# Patient Record
Sex: Female | Born: 1965
Health system: Southern US, Community
[De-identification: ages and names within clinical notes are randomized; demographics above are authoritative.]

## PROBLEM LIST (undated history)

## (undated) DIAGNOSIS — K219 Gastro-esophageal reflux disease without esophagitis: Secondary | ICD-10-CM

## (undated) DIAGNOSIS — J449 Chronic obstructive pulmonary disease, unspecified: Secondary | ICD-10-CM

## (undated) DIAGNOSIS — R112 Nausea with vomiting, unspecified: Secondary | ICD-10-CM

## (undated) DIAGNOSIS — J129 Viral pneumonia, unspecified: Secondary | ICD-10-CM

## (undated) DIAGNOSIS — G4730 Sleep apnea, unspecified: Secondary | ICD-10-CM

## (undated) DIAGNOSIS — Z9889 Other specified postprocedural states: Secondary | ICD-10-CM

## (undated) DIAGNOSIS — IMO0001 Reserved for inherently not codable concepts without codable children: Secondary | ICD-10-CM

## (undated) DIAGNOSIS — M797 Fibromyalgia: Secondary | ICD-10-CM

## (undated) DIAGNOSIS — H9190 Unspecified hearing loss, unspecified ear: Secondary | ICD-10-CM

## (undated) HISTORY — DX: Sleep apnea, unspecified: G47.30

## (undated) HISTORY — DX: Fibromyalgia: M79.7

## (undated) HISTORY — DX: Unspecified hearing loss, unspecified ear: H91.90

## (undated) HISTORY — PX: TOTAL ABDOMINAL HYSTERECTOMY: SHX209

## (undated) HISTORY — DX: Reserved for inherently not codable concepts without codable children: IMO0001

## (undated) HISTORY — DX: Viral pneumonia, unspecified: J12.9

## (undated) HISTORY — DX: Gastro-esophageal reflux disease without esophagitis: K21.9

---
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
## 2001-04-03 ENCOUNTER — Other Ambulatory Visit: Admission: RE | Admit: 2001-04-03 | Discharge: 2001-04-03 | Payer: Self-pay | Admitting: Obstetrics and Gynecology

## 2002-07-17 ENCOUNTER — Inpatient Hospital Stay (HOSPITAL_COMMUNITY): Admission: RE | Admit: 2002-07-17 | Discharge: 2002-07-20 | Payer: Self-pay | Admitting: Obstetrics & Gynecology

## 2002-12-18 ENCOUNTER — Other Ambulatory Visit: Admission: RE | Admit: 2002-12-18 | Discharge: 2002-12-18 | Payer: Self-pay | Admitting: Obstetrics & Gynecology

## 2008-07-09 ENCOUNTER — Encounter: Payer: Self-pay | Admitting: Internal Medicine

## 2008-07-11 ENCOUNTER — Ambulatory Visit: Payer: Self-pay | Admitting: Internal Medicine

## 2008-07-11 DIAGNOSIS — L509 Urticaria, unspecified: Secondary | ICD-10-CM | POA: Insufficient documentation

## 2008-07-11 DIAGNOSIS — J309 Allergic rhinitis, unspecified: Secondary | ICD-10-CM | POA: Insufficient documentation

## 2008-07-11 LAB — CONVERTED CEMR LAB
ALT: 16 units/L (ref 0–35)
AST: 18 units/L (ref 0–37)
Albumin: 4.6 g/dL (ref 3.5–5.2)
BUN: 15 mg/dL (ref 6–23)
Basophils Relative: 0.5 % (ref 0.0–3.0)
CO2: 29 meq/L (ref 19–32)
Chloride: 102 meq/L (ref 96–112)
Creatinine, Ser: 0.8 mg/dL (ref 0.4–1.2)
Eosinophils Absolute: 0 10*3/uL (ref 0.0–0.7)
Eosinophils Relative: 0.4 % (ref 0.0–5.0)
GFR calc non Af Amer: 84 mL/min
Glucose, Bld: 92 mg/dL (ref 70–99)
HCT: 41.1 % (ref 36.0–46.0)
MCV: 86.1 fL (ref 78.0–100.0)
Monocytes Relative: 5.4 % (ref 3.0–12.0)
Neutrophils Relative %: 70.8 % (ref 43.0–77.0)
RBC: 4.77 M/uL (ref 3.87–5.11)
Sed Rate: 10 mm/hr (ref 0–22)
Total Protein: 7.5 g/dL (ref 6.0–8.3)
WBC: 10.2 10*3/uL (ref 4.5–10.5)

## 2008-07-15 DIAGNOSIS — G4730 Sleep apnea, unspecified: Secondary | ICD-10-CM | POA: Insufficient documentation

## 2008-08-29 ENCOUNTER — Ambulatory Visit: Payer: Self-pay | Admitting: Internal Medicine

## 2008-08-29 DIAGNOSIS — G4700 Insomnia, unspecified: Secondary | ICD-10-CM

## 2009-05-06 ENCOUNTER — Ambulatory Visit: Payer: Self-pay | Admitting: Internal Medicine

## 2010-07-16 ENCOUNTER — Telehealth (INDEPENDENT_AMBULATORY_CARE_PROVIDER_SITE_OTHER): Payer: Self-pay

## 2010-07-22 ENCOUNTER — Ambulatory Visit: Payer: Self-pay | Admitting: Internal Medicine

## 2010-07-22 DIAGNOSIS — K219 Gastro-esophageal reflux disease without esophagitis: Secondary | ICD-10-CM

## 2010-07-22 DIAGNOSIS — R1310 Dysphagia, unspecified: Secondary | ICD-10-CM | POA: Insufficient documentation

## 2010-07-22 DIAGNOSIS — R1013 Epigastric pain: Secondary | ICD-10-CM | POA: Insufficient documentation

## 2010-07-23 ENCOUNTER — Encounter: Payer: Self-pay | Admitting: Internal Medicine

## 2010-08-17 ENCOUNTER — Ambulatory Visit (HOSPITAL_COMMUNITY): Admission: RE | Admit: 2010-08-17 | Discharge: 2010-08-17 | Payer: Self-pay | Admitting: Internal Medicine

## 2010-08-17 ENCOUNTER — Encounter (INDEPENDENT_AMBULATORY_CARE_PROVIDER_SITE_OTHER): Payer: Self-pay | Admitting: *Deleted

## 2010-08-17 ENCOUNTER — Ambulatory Visit: Payer: Self-pay | Admitting: Internal Medicine

## 2010-08-18 HISTORY — PX: ESOPHAGOGASTRODUODENOSCOPY: SHX1529

## 2010-08-19 ENCOUNTER — Encounter: Payer: Self-pay | Admitting: Gastroenterology

## 2010-08-24 ENCOUNTER — Encounter: Payer: Self-pay | Admitting: Internal Medicine

## 2010-11-17 NOTE — Medication Information (Signed)
Summary: ACIPHEX 20MG   ACIPHEX 20MG    Imported By: Rexene Alberts 08/17/2010 15:43:01  _____________________________________________________________________  External Attachment:    Type:   Image     Comment:   External Document  Appended Document: ACIPHEX 20MG  pt has tried prevacid, protonix, dexilant, prilosec two times a day. None of these stopped reflux. pt wants [place] to do a PA for Aciphex. She currently has some prilosec at home and is going to take it untill she hears back from [place] about PA.  Appended Document: ACIPHEX 20MG  Do we have samples Aciphex to give her for 2 week trial?  Proceed w/ PA.  Thanks  Appended Document: ACIPHEX 20MG  No samples of the Aciphex.  Appended Document: ACIPHEX 20MG  PA form on [facility] desk  Appended Document: ACIPHEX 20MG  PA has been approved and faxed to [facility]  Appended Document: ACIPHEX 20MG  pt aware, PA only good through 10/17/2010

## 2010-11-17 NOTE — Letter (Signed)
Summary: Patient Notice, Endo Biopsy Results  [facility]  [address]   [place], [place] [address]   Phone: [phone]  Fax: [phone]       August 24, 2010   Dawn Hammond [address] [place], [place]  [address] [birth year]    Dear Dawn Hammond,  I am pleased to inform you that the biopsies taken during your recent endoscopic examination did not show any evidence of cancer or other abnormality upon pathologic examination.  Additional information/recommendations:  No further action is needed at this time.  Please follow-up with your primary care physician for your other healthcare needs.  Continue with the treatment plan as outlined on the day of your exam.  Please call us if you are having persistent problems or have questions about your condition that have not been fully answered at this time.  Sincerely,    R. Roetta Sessions MD, FACP [facility] Ph: [phone]   Fax: [phone]   Appended Document: Patient Notice, Endo Biopsy Results letter mailed to pt

## 2010-11-17 NOTE — Assessment & Plan Note (Signed)
Summary: abd pain,consult for EGD/ss   Visit Type:  consult Referring Provider:  Dr. Sherryll Burger Primary Care Provider:  Dr. Sherryll Burger  Chief Complaint:  abd pain.  History of Present Illness: Ms. Dawn Hammond is a pleasant 45 y/o WF, patient of Dr. Sherryll Burger, who presents for further evaluation of abd pain and chronic GERD. She states she has had GERD since a teenager. Never had EGD. Has been on meds off/on for years. For couple of years, feels prickling feeling, short-lived, migrates around belly. Three weeks ago, prickling feeling and then epigastric burning. Gradually got worse. Burning then progressed throughout abd. Sometimes intermittent. Doesn't seemed to be related to meals. But notes OJ made worse and TUMS made worse. Pain localized in epigastrium. Burning into chest/throat. Little better on Thursday but after lifted heavy box yesterday the epigastric pain started again. No n/v.  Has been on meds chronically. Nexium, Prevacid doesn't control. Nocturnal regurgitation especially if overeat. Was taking Nexium, insurance made her take Prilosec 20mg  two times a day. Recently switched to Dexilant (given samples). Nothing really seems to help. Normally very constipated type person, but lately frequent soft stools. BM 2-3 per day. No nocturnal BMs. No melena, brbpr. Intermittent dysphagia to solid foods, happens several days in a row but may not happen but couple of times per month. Present for two to three years.   Current Medications (verified): 1)  Premarin 0.625 Mg Tabs (Estrogens Conjugated) .... Take 1 Tablet By Mouth Once A Day 2)  Ibuprofen 200 Mg Tabs (Ibuprofen) .... Take By Mouth As Needed 3)  Famotidine 20 Mg Tabs (Famotidine) .Marland Kitchen.. 1 Daily 4)  Loratadine 10 Mg Tabs (Loratadine) .... 2 Daily As Dierected 5)  Dexilant 60 Mg Cpdr (Dexlansoprazole) .... Once Daily  Allergies (verified): 1)  ! Oxycodone Hcl (Oxycodone Hcl) 2)  ! Amoxicillin (Amoxicillin) 3)  Hydrocodone  Past History:  Past Medical  History: Viral pneumonia 1984, hosp x 2, tracheostomy, "collapsed lung"  Impaired hearing due to antibiotics Chronic sinusitis- Dr. Lazarus Salines Sleep Apnea Urticaria- Skin test 05/06/09 Fibromyalgia GERD, chronic Allergies/hives, on famotidine/loratadine  Past Surgical History: Total Abdominal Hysterectomy  Family History: allergies cancers Maternal aunt, breast cancer. Maternal aunt, uterine cancer. Maternal GF, skin cancer. Paternal GM, esophageal problems. No FH of CRC.  Social History: Patient never smoked. No alcohol. Retired due to hearing impairment.  Married. [place].   Review of Systems General:  Denies fever, chills, sweats, anorexia, fatigue, malaise, and weight loss. Eyes:  Denies vision loss. ENT:  Complains of difficulty swallowing; denies nasal congestion, sore throat, and hoarseness. CV:  Complains of palpitations; denies chest pains, angina, dyspnea on exertion, and peripheral edema. Resp:  Denies dyspnea at rest, dyspnea with exercise, cough, sputum, and wheezing. GI:  See HPI. GU:  Denies urinary burning and blood in urine. MS:  Denies joint pain / LOM. Derm:  Denies rash and itching. Neuro:  Denies weakness, frequent headaches, memory loss, and confusion. Psych:  Denies depression and anxiety. Endo:  Denies unusual weight change. Heme:  Denies bruising and bleeding. Allergy:  Denies hives and rash.  Vital Signs:  Patient profile:   45 year old female Height:      64.5 inches Weight:      162 pounds BMI:     27.48 Temp:     98.2 degrees F oral Pulse rate:   76 / minute BP sitting:   122 / 84  (left arm) Cuff size:   regular  Vitals Entered By: Hendricks Limes LPN (July 22, 2010 11:18 AM)  Physical Exam  General:  Well developed, well nourished, no acute distress. Head:  Normocephalic and atraumatic. Eyes:  Conjunctivae pink, no scleral icterus.  Mouth:  Oropharyngeal mucosa moist, pink.  No lesions, erythema or exudate.    Neck:  Supple; no  masses or thyromegaly. Lungs:  Clear throughout to auscultation. Heart:  Regular rate and rhythm; no murmurs, rubs,  or bruits. Abdomen:  Bowel sounds normal.  Abdomen is soft, nondistended.  Moderate epigastric tenderness. No rebound or guarding.  No hepatosplenomegaly, masses or hernias.  No abdominal bruits.  Extremities:  No clubbing, cyanosis, edema or deformities noted. Neurologic:  Alert and  oriented x4;  grossly normal neurologically. Skin:  Intact without significant lesions or rashes. Cervical Nodes:  No significant cervical adenopathy. Psych:  Alert and cooperative. Normal mood and affect.  Impression & Recommendations:  Problem # 1:  EPIGASTRIC PAIN (ICD-789.06)  Progressive epigastric pain in setting of PPI and prn NSAID use. Also with refractory GERD/regurgitation, especially at night. She has h/o GERD for 30 years but no prior EGD. She c/o dysphagia to solid foods. Likely has esophageal ring or stricture but malignancy not excluded. EGD/ED to be performed in near future.  Risks, alternatives, benefits including but not limited to risk of reaction to medications, bleeding, infection, and perforation addressed.  Patient voiced understanding and verbal consent obtained.   Orders: Consultation Level III (10272) I would like to thank Dr. Sherryll Burger for allowing [place] to take part in the care of this nice patient.

## 2010-11-17 NOTE — Progress Notes (Signed)
Summary: pt ? about appt date  Phone Note Call from Patient Call back at Home Phone [phone]   Caller: Patient Summary of Call: pt called- has appt with LSL on 07/30/2010. Pt is requesting that I give LSL her symptoms to see if she needs sooner appt. pt stated her problems started with a "prickly feeling" in her stomach that turned into a burning sensation and now she feels burning and pressure in stomach and esophagus. She feels she is getting worse and if she has to wait untill the 13th she is not sure what kind of shape she will be in. She is currently taking dexilant qd from her pcp. please advise if you think pt needs sooner appt Initial call taken by: Hendricks Limes LPN,  July 16, 2010 11:09 AM     Appended Document: pt ? about appt date See if we can get her in next week.  Appended Document: pt ? about appt date pt is aware of her new appt day and time (07/22/10 @ 1130 w/LSL)

## 2010-11-17 NOTE — Letter (Signed)
Summary: EGD/ED ORDER  EGD/ED ORDER   Imported By: Ave Filter 07/22/2010 12:21:14  _____________________________________________________________________  External Attachment:    Type:   Image     Comment:   External Document

## 2010-11-17 NOTE — Letter (Signed)
Summary: REFERRAL FROM DR [facility]  REFERRAL FROM DR [facility]   Imported By: Rexene Alberts 07/23/2010 11:40:28  _____________________________________________________________________  External Attachment:    Type:   Image     Comment:   External Document

## 2010-12-21 ENCOUNTER — Encounter: Payer: Self-pay | Admitting: Gastroenterology

## 2010-12-29 NOTE — Medication Information (Signed)
Summary: PA for aciphex  PA for aciphex   Imported By: Hendricks Limes LPN 59/56/3875 64:33:29  _____________________________________________________________________  External Attachment:    Type:   Image     Comment:   External Document

## 2010-12-29 NOTE — Medication Information (Signed)
Summary: PA for aciphex  PA for aciphex   Imported By: Hendricks Limes LPN 04/54/0981 19:14:78  _____________________________________________________________________  External Attachment:    Type:   Image     Comment:   External Document

## 2011-01-29 ENCOUNTER — Other Ambulatory Visit: Payer: Self-pay | Admitting: Internal Medicine

## 2011-03-05 NOTE — Group Therapy Note (Signed)
   NAMEVANCE, BELCOURT                      ACCOUNT NO.:  [id number]   MEDICAL RECORD NO.:  [id number]                   PATIENT TYPE:  INP   LOCATION:  A327                                 FACILITY:  [facility]   PHYSICIAN:  Lazaro Arms, M.D.                DATE OF BIRTH:  Mar 31, 1966   DATE OF PROCEDURE:  07/18/2002  DATE OF DISCHARGE:                                   PROGRESS NOTE   TIME:  0840.   SUBJECTIVE:  The patient is postop day #1, a TAH-BSO.   The patient is awake, alert, without complaints.  Her pain is well-  controlled on the PCA pump.  She has no nausea and wants to eat regular  food.  She has voided.  She has been out of bed and ambulating without  difficulty.   OBJECTIVE:  Temperature maximum is 99.6, pulse is 90, respirations 20, blood  pressure is 99/56.  Urine output is excellent, and her urine is clear.   Postop day #1, hemoglobin is 10.6, hematocrit was 30.5, white count is 9100,  platelets 292,000, and the neutrophils are 68%.  Abdomen is soft,  appropriately tender, nondistended.  The incision is clean, dry, and intact.  Extremities warm with no edema.   ASSESSMENT:  Postoperative day #1, total abdominal hysterectomy-bilateral  salpingo-oophorectomy.   PLAN:  We will do routine postop care, discontinue her IV and PCA pump and  begin oral pain medicine.  Discontinue IV Toradol and give oral Toradol.  Liberalize her diet to regular and have her ambulate, and otherwise routine  postop care.                                                 Lazaro Arms, M.D.    Loraine Maple  D:  07/18/2002  T:  07/18/2002  Job:  [id number]

## 2011-03-05 NOTE — Discharge Summary (Signed)
   NAMEHIILEI, GERST                      ACCOUNT NO.:  [id number]   MEDICAL RECORD NO.:  [id number]                   PATIENT TYPE:  INP   LOCATION:  A327                                 FACILITY:  [facility]   PHYSICIAN:  Lazaro Arms, M.D.                DATE OF BIRTH:  [birth year]   DATE OF ADMISSION:  07/17/2002  DATE OF DISCHARGE:  07/20/2002                                 DISCHARGE SUMMARY   HISTORY OF PRESENT ILLNESS:  The patient is postoperative day #3 from a  TAH/BSO.  She has a T-max in the past 24 hours of 100.4.  Her current  temperature is 97.5.  Her pulse is 85, respirations are 18, and blood  pressure is 98/63.  Urine output is excellent and clear.  She is ambulatory,  tolerating a regular diet.  She is voiding without symptoms.  She has had  multiple bowel movements and passing gas.   PHYSICAL EXAMINATION:  ABDOMEN:  Her abdomen is less distended than  yesterday.  It is soft and evidence that her ileus is resolving.  Her  incision is clean, dry, and intact.  EXTREMITIES:  Warm with no edema.   LABORATORY DATA:  Hemoglobin and hematocrit this morning were 9.4 and 27.5  and a white count of 7900.  Platelet count is 235,000.   ASSESSMENT:  The patient is postoperative day #3 from a total abdominal  hysterectomy/bilateral salpingo-oophorectomy.   PLAN:  The patient is ready for discharge.   DISCHARGE MEDICATIONS:  1. Percocet 10/650 mg #50 one to two p.o. q.4h. p.r.n. pain.  2. Toradol 10 mg #15 one p.o. q.8h. x5 days.  3. Ambien 10 mg 1 p.o. q.h.s. p.r.n. sleep.   FOLLOW UP:  We will see her back in the office on October 7 in the  afternoon.  She is given instructions and precautions for return prior to  that time.  If she has any other problems that require evaluation, she is  instructed to contact our office.                                               Lazaro Arms, M.D.    Loraine Maple  D:  07/20/2002  T:  07/23/2002  Job:  [id number]

## 2011-03-05 NOTE — Op Note (Signed)
Dawn Hammond, Dawn Hammond                      ACCOUNT NO.:  [id number]   MEDICAL RECORD NO.:  [id number]                   PATIENT TYPE:  AMB   LOCATION:  DAY                                  FACILITY:  [facility]   PHYSICIAN:  Lazaro Arms, M.D.                DATE OF BIRTH:  Nov 05, 1965   DATE OF PROCEDURE:  07/17/2002  DATE OF DISCHARGE:                                 OPERATIVE REPORT   PREOPERATIVE DIAGNOSES:  1. Dysfunctional bleeding.  2. Dysmenorrhea.  3. Severe PMDD.   POSTOPERATIVE DIAGNOSES:  1. Dysfunctional bleeding.  2. Dysmenorrhea.  3. Severe PMDD.   PROCEDURE:  1. Total abdominal hysterectomy.  2. Bilateral salpingo-oophorectomy   SURGEON:  Lazaro Arms, M.D.   ANESTHESIA:  General endotracheal   FINDINGS:  The patient had both ovaries stuck to the posterior of the  uterus.  There may have been some endometriosis present, not definitively,  but that was a possibility.  There were really no other abnormalities in the  pelvis.   DESCRIPTION OF PROCEDURE:  The patient was taken to the operating room and  placed in the supine position and underwent general endotracheal anesthesia.  She was prepped and draped in the usual sterile fashion.  A Pfannenstiel  skin incision made and carried down sharply through the rectus fascia which  was scored in the midline and extended laterally.  The fascia was taken off  the muscle superiorly and inferiorly without difficult.  The muscles were  divided.  The peritoneal cavity was entered.  A Balfour self-retaining  retractor was placed.  The upper abdomen was packed away.   The uterus was grasped and the cornu. The left round ligament was suture  ligated and cut.  The vesicouterine serosal flap on the left was created.  The infundibulopelvic ligament, on the left, was clamped, cut, and double  suture ligated.  The right round ligament was then suture ligated and cut.  The vesicouterine serosal flap on the right was created  and the bladder was  pushed off the lower uterine segment.  The infundibulopelvic ligament on the  right was clamped, cut and double suture ligated.  The uterine vessels were  skeletonized and clamped, cut, and suture ligated.   Serial pedicles were taken down the cervix through the Cardinal ligament  with each pedicle being clamped, cut, transfixed, and suture ligated.   The vagina was cross clamped, specimen was removed, and vaginal angle  sutures were placed and the vagina was closed with interrupted figure-of-  eight sutures.  The uterosacral ligaments were then plicated in the midline  and sutured to the vagina as well for additional vaginal support.  The  pelvis was irrigated vigorously.  All pedicles were found to be hemostatic.  The patient tolerated the procedure well.  She experienced 350 cc of blood  loss.  Balfour self-retaining retractor was removed.  The muscles were  reapproximated loosely.  The fascia was closed using #0 Vicryl running.  Subcutaneous tissue was made hemostatic and irrigated.  Subcutaneous sutures  were placed, interrupted, and then subcuticular and Dermabond was used for  skin adhesive and for dressing.  The patient tolerated the procedure well;  taken to the recovery room in good stable condition.  All counts were  correct x3.                                               Lazaro Arms, M.D.    Loraine Maple  D:  07/17/2002  T:  07/17/2002  Job:  [id number]

## 2011-03-05 NOTE — H&P (Signed)
Dawn Hammond, Dawn Hammond                      ACCOUNT NO.:  [id number]   MEDICAL RECORD NO.:  [id number]                   PATIENT TYPE:  AMB   LOCATION:  DAY                                  FACILITY:  [facility]   PHYSICIAN:  Lazaro Arms, M.D.                DATE OF BIRTH:  25-Jul-1966   DATE OF ADMISSION:  DATE OF DISCHARGE:                                HISTORY & PHYSICAL   HISTORY OF PRESENT ILLNESS:  The patient is a 45 year old  white female  gravida 4, para 1 abortus 3 who is admitted for a TH/BSO.  She has been  having really almost constant spotting, fatigue, leg cramps, heavy bleeding  with her periods and lots of pain with her periods.  We put on a milligram  of Estrace to try to prevent intermenstrual spotting and her menstrual  migraines, but was that was to no avail.  Thyroid function tests, FSH and LH  were all drawn, and were normal.  She also has a lot of problems with severe  premenstrual dysphoric disorder.  She has body aches right at the time of  her menses, leg cramping and, of course, intermittent spotting, which have  not responded.  We have talked about doing a hysteroscopy, D&C, endometrial  ablation, and hormonal manipulation; however, after talking to her for  awhile and the significant nature of her symptoms I really felt like a  TH/BSO was more indicated.  As a result we will proceed with that.  She  understands she will require hormone replacement therapy.   PAST MEDICAL HISTORY:  Significant for depression and severe premenstrual  dysphoric disorder.   PAST SURGICAL HISTORY:  Negative.   PAST OBSTETRICAL HISTORY:  She has an elective termination and miscarriage  with a D&C for a 14-week pregnancy loss.  She also had a pregnancy that was  ended at seven months because the patient was in a coma and thought she was  going to do poorly.   CURRENT MEDICATIONS:  Ambien, Vioxx and Aleve, and ibuprofen as well.  She  takes Celexa for premenstrual  dysphoric disorder.   REVIEW OF SYSTEMS:  Her review of systems is otherwise negative.   PHYSICAL EXAMINATION:  VITAL SIGNS:  Blood pressure 120/80, weight 156  pounds.  HEENT:  Unremarkable.  NECK:  Thyroid is normal.  LUNGS:  Clear.  HEART:  Regular rate and rhythm without murmur, rub or gallop.  BREASTS:  Without mass, discharge or skin changes.  ABDOMEN:  Benign.  No hepatosplenomegaly or masses.  GENITALIA:  She has normal external genitalia.  Vagina is without discharge.  Cervix parous without lesions.  Uterus; normal size, shape and contour.  Ovaries are normal and nontender.  EXTREMITIES:  Warm without edema.  NEUROLOGIC EXAMINATION:  Grossly intact.   IMPRESSION:  1. Dysfunction uterine bleeding not responsive to conservative measures.  2. Dysmenorrhea.  3. Severe premenstrual dysphoric disorder.  4. Other  cyclical symptoms consistent with negative fibromyalgia picture.   PLAN:  The patient is admitted for TH/BSO.   She understands the risks, benefits, indications, and alternatives, and will  proceed.                                                   Lazaro Arms, M.D.    Dawn Hammond  D:  07/16/2002  T:  07/17/2002  Job:  [id number]

## 2011-03-05 NOTE — Group Therapy Note (Signed)
   Dawn Hammond, AXELSON                      ACCOUNT NO.:  [id number]   MEDICAL RECORD NO.:  [id number]                   PATIENT TYPE:  INP   LOCATION:  A327                                 FACILITY:  [facility]   PHYSICIAN:  Lazaro Arms, M.D.                DATE OF BIRTH:  01/04/1966   DATE OF PROCEDURE:  07/19/2002  DATE OF DISCHARGE:                                   PROGRESS NOTE   SUBJECTIVE:  The patient feels pretty good.  Her pain is in better control  today than yesterday.  She has been ambulatory, passing gas but still feels  distended, no bowel movement.  She is voiding without symptoms.   OBJECTIVE:  Her T-max is 100.3, her pulse is 105, respirations are 20, blood  pressure is 102/62.  Abdomen I will say is mild to moderately distended.  She has positive bowel sounds but it is soft.  Her incision is clean, dry  and intact.  She has stable bruising in the superior left aspect of the  incision.  There is no evidence of a hematoma.  Extremities are warm without  edema.   ASSESSMENT:  1. Postoperative day #2, total abdominal hysterectomy/bilateral salpingo-     oophorectomy.  2. Mild ileus postoperatively.   PLAN:  In view of the patient's suppositories, I will have her ambulate  today and keep her today and probably evaluate for discharge tomorrow.  She  will have a CBC ordered for in the morning.                                                Lazaro Arms, M.D.    Loraine Maple  D:  07/19/2002  T:  07/19/2002  Job:  [id number]

## 2011-04-03 LAB — CBC
HCT: 39 %
MCV: 84.7 fL
platelet count: 310

## 2011-04-03 LAB — COMPREHENSIVE METABOLIC PANEL
ALT: 15 U/L (ref 7–35)
AST: 17 U/L
Albumin: 4.1
Alkaline Phosphatase: 56 U/L
Calcium: 8.8 mg/dL
Total Bilirubin: 0.5 mg/dL

## 2011-06-23 ENCOUNTER — Ambulatory Visit (INDEPENDENT_AMBULATORY_CARE_PROVIDER_SITE_OTHER): Payer: Medicare HMO | Admitting: Gastroenterology

## 2011-06-23 ENCOUNTER — Encounter: Payer: Self-pay | Admitting: Gastroenterology

## 2011-06-23 DIAGNOSIS — R1013 Epigastric pain: Secondary | ICD-10-CM

## 2011-06-23 DIAGNOSIS — R1032 Left lower quadrant pain: Secondary | ICD-10-CM | POA: Insufficient documentation

## 2011-06-23 DIAGNOSIS — K449 Diaphragmatic hernia without obstruction or gangrene: Secondary | ICD-10-CM

## 2011-06-23 DIAGNOSIS — R1310 Dysphagia, unspecified: Secondary | ICD-10-CM

## 2011-06-23 DIAGNOSIS — K5900 Constipation, unspecified: Secondary | ICD-10-CM | POA: Insufficient documentation

## 2011-06-23 MED ORDER — POLYETHYLENE GLYCOL 3350 17 GM/SCOOP PO POWD
17.0000 g | Freq: Every day | ORAL | Status: DC
Start: 2011-06-23 — End: 2011-07-14

## 2011-06-23 NOTE — Progress Notes (Signed)
Primary Care Physician:  [name], MD  Primary Gastroenterologist:  Roetta Sessions, MD   Chief Complaint  Patient presents with  . Abdominal Pain    stomach burning  . Hiatal Hernia    pain on the left side    HPI:  Dawn Hammond is a 45 y.o. female here for further evaluation of new LLQ pain which radiates into left abd and into epigastrium. At times burning quality.  May be there for several days or weeks and then none for several weeks. No fever. Pain not worse with meals. Not necessarily worse with movement. Nothing makes it better. Always constipation. Takes Calcium for osteopenia. Given Bentyl for constipation but has not started yet. Equate stools softners with laxative. Straining to have BM makes abd pain worse. No melena, bpbpr. May take two days after taking laxative to have BM.   No heartburn. Follows antireflux measures. No dysphagia.    Current Outpatient Prescriptions  Medication Sig Dispense Refill  . dicyclomine (BENTYL) 10 MG capsule Take 10 mg by mouth 4 (four) times daily -  before meals and at bedtime.        Marland Kitchen estradiol (ESTRACE) 1 MG tablet Take 1 mg by mouth daily.       . famotidine (PEPCID) 20 MG tablet Take 20 mg by mouth 2 (two) times daily.        Marland Kitchen ibuprofen (ADVIL,MOTRIN) 200 MG tablet Take 200 mg by mouth every 6 (six) hours as needed.        . loratadine (CLARITIN) 10 MG tablet Take 10 mg by mouth daily.        Marland Kitchen omeprazole (PRILOSEC) 20 MG capsule Take 20 mg by mouth daily.       . polyethylene glycol powder (GLYCOLAX/MIRALAX) powder Take 17 g by mouth daily.  255 g  0    Allergies as of 06/23/2011 - Review Complete 06/23/2011  Allergen Reaction Noted  . Amoxicillin  07/22/2010  . Hydrocodone    . Oxycodone hcl  07/22/2010    Past Medical History  Diagnosis Date  . Pneumonia, viral 1984    x2 tracheostomy/ Collapsed lung  . Impaired hearing   . Chronic sinusitis   . Sleep apnea   . Fibromyalgia   . GERD (gastroesophageal reflux  disease)   . Urticaria     Past Surgical History  Procedure Date  . Total abdominal hysterectomy   . Esophagogastroduodenoscopy 08/2010    noncritical schatzki ring s/p dilation, small hh, biopsy negative for EE    Family History  Problem Relation Age of Onset  . Breast cancer Maternal Aunt   . Uterine cancer Maternal Aunt   . Colon cancer Neg Hx     History   Social History  . Marital Status: Married    Spouse Name: N/A    Number of Children: N/A  . Years of Education: N/A   Occupational History  . retired due to hearing impairment    Social History Main Topics  . Smoking status: Never Smoker   . Smokeless tobacco: Not on file  . Alcohol Use: Yes     a glass of wine here and there  . Drug Use: No  . Sexually Active: Not on file   Other Topics Concern  . Not on file   Social History Narrative   stepson      ROS:  General: Negative for anorexia, weight loss, fever, chills, fatigue, weakness. Eyes: Negative for vision changes.  ENT: Negative for hoarseness,  difficulty swallowing , nasal congestion. CV: Negative for chest pain, angina, palpitations, dyspnea on exertion, peripheral edema.  Respiratory: Negative for dyspnea at rest, dyspnea on exertion, cough, sputum, wheezing.  GI: See history of present illness. GU:  Negative for dysuria, hematuria, urinary incontinence, urinary frequency, nocturnal urination.  MS: Negative for joint pain, low back pain.  Derm: Negative for rash or itching.  Neuro: Negative for weakness, abnormal sensation, seizure, frequent headaches, memory loss, confusion.  Psych: Negative for anxiety, depression, suicidal ideation, hallucinations.  Endo: Negative for unusual weight change.  Heme: Negative for bruising or bleeding. Allergy: Negative for rash or hives.    Physical Examination:  BP 113/78  Pulse 67  Temp(Src) 97.8 F (36.6 C) (Temporal)  Ht 5\' 4"  (1.626 m)  Wt 155 lb 6.4 oz (70.489 kg)  BMI 26.67 kg/m2   General:  Well-nourished, well-developed in no acute distress.  Head: Normocephalic, atraumatic.   Eyes: Conjunctiva pink, no icterus. Mouth: Oropharyngeal mucosa moist and pink , no lesions erythema or exudate. Neck: Supple without thyromegaly, masses, or lymphadenopathy.  Lungs: Clear to auscultation bilaterally.  Heart: Regular rate and rhythm, no murmurs rubs or gallops.  Abdomen: Bowel sounds are normal, LLQ tenderness without rebound, nondistended, no hepatosplenomegaly or masses, no abdominal bruits or hernia.   Rectal: Not performed. Extremities: No lower extremity edema. No clubbing or deformities.  Neuro: Alert and oriented x 4 , grossly normal neurologically.  Skin: Warm and dry, no rash or jaundice.   Psych: Alert and cooperative, normal mood and affect.

## 2011-06-23 NOTE — Assessment & Plan Note (Addendum)
Miralax 17g daily. Check ifobt. Retrieve labs from PCP to see if thyroid and calcium levels done.

## 2011-06-23 NOTE — Assessment & Plan Note (Signed)
LLQ pain into upper abdomen seems to be unrelated or unaffected by bowel movements. She has chronic constipation but manages it with stool softner with laxative 2 times a week. CT A/P for further evaluation. May ultimately need colonoscopy.

## 2011-06-24 NOTE — Progress Notes (Signed)
Cc to PCP 

## 2011-06-29 ENCOUNTER — Telehealth: Payer: Self-pay | Admitting: Gastroenterology

## 2011-06-29 NOTE — Telephone Encounter (Signed)
Pt called to let us know she is know having R sided pelvic pain, this started on Saturday.  She is scheduled for her CT scan on 07/06/11.

## 2011-06-30 NOTE — Telephone Encounter (Signed)
Please get more info on how patient is doing. Bowels moving? Fever? Location of abd pain, was having epig and llq. Can we do CT sooner.?

## 2011-07-02 ENCOUNTER — Ambulatory Visit (INDEPENDENT_AMBULATORY_CARE_PROVIDER_SITE_OTHER): Payer: Medicare HMO | Admitting: Gastroenterology

## 2011-07-02 DIAGNOSIS — R109 Unspecified abdominal pain: Secondary | ICD-10-CM

## 2011-07-05 NOTE — Telephone Encounter (Signed)
Spoke with pt- she is feeling better, only has pain at times, usually when she lays down at night to go to bed. Having ct tomorrow.

## 2011-07-05 NOTE — Progress Notes (Signed)
Quick Note:  IFOBT negative. CT tomorrow. Still need labs from PCP, please request again. How is patient doing?? ______

## 2011-07-06 ENCOUNTER — Ambulatory Visit (HOSPITAL_COMMUNITY)
Admission: RE | Admit: 2011-07-06 | Discharge: 2011-07-06 | Disposition: A | Discharge status: Home / Self Care | Payer: Medicare HMO | Source: Ambulatory Visit | Attending: Gastroenterology | Admitting: Gastroenterology

## 2011-07-06 DIAGNOSIS — R1013 Epigastric pain: Secondary | ICD-10-CM | POA: Insufficient documentation

## 2011-07-06 DIAGNOSIS — R1310 Dysphagia, unspecified: Secondary | ICD-10-CM

## 2011-07-06 DIAGNOSIS — R1032 Left lower quadrant pain: Secondary | ICD-10-CM | POA: Insufficient documentation

## 2011-07-06 MED ORDER — IOHEXOL 300 MG/ML  SOLN
100.0000 mL | Freq: Once | INTRAMUSCULAR | Status: AC | PRN
  Administered 2011-07-06: 100 mL via INTRAVENOUS

## 2011-07-07 NOTE — Progress Notes (Signed)
Requested for the second time

## 2011-07-11 NOTE — Progress Notes (Signed)
Quick Note:  Let's bring patient back in for f/u OV to discuss next step. May need TCS. Would like to discuss CT findings with her at OV. ?early cirrhosis? ______

## 2011-07-12 NOTE — Progress Notes (Signed)
Quick Note:  Please see result note attached to CT. ______

## 2011-07-13 NOTE — Progress Notes (Signed)
Pt is scheduled to see LL on 07/14/11

## 2011-07-14 ENCOUNTER — Ambulatory Visit (INDEPENDENT_AMBULATORY_CARE_PROVIDER_SITE_OTHER): Payer: Medicare HMO | Admitting: Gastroenterology

## 2011-07-14 ENCOUNTER — Encounter: Payer: Self-pay | Admitting: Gastroenterology

## 2011-07-14 VITALS — BP 119/76 | HR 77 | Temp 98.1°F | Ht 64.0 in | Wt 155.0 lb

## 2011-07-14 DIAGNOSIS — K5900 Constipation, unspecified: Secondary | ICD-10-CM

## 2011-07-14 DIAGNOSIS — R1013 Epigastric pain: Secondary | ICD-10-CM

## 2011-07-14 DIAGNOSIS — R1032 Left lower quadrant pain: Secondary | ICD-10-CM

## 2011-07-14 DIAGNOSIS — R945 Abnormal results of liver function studies: Secondary | ICD-10-CM

## 2011-07-14 DIAGNOSIS — N9489 Other specified conditions associated with female genital organs and menstrual cycle: Secondary | ICD-10-CM | POA: Insufficient documentation

## 2011-07-14 DIAGNOSIS — R932 Abnormal findings on diagnostic imaging of liver and biliary tract: Secondary | ICD-10-CM | POA: Insufficient documentation

## 2011-07-14 MED ORDER — POLYETHYLENE GLYCOL 3350 17 GM/SCOOP PO POWD: 17.0000 g | Freq: Every day | ORAL | Status: AC

## 2011-07-14 NOTE — Assessment & Plan Note (Signed)
Chronic constipation, left-sided abdominal pain. Unclear if abdominal pain is secondary to pelvic congestion syndrome, constipation, multifactorial. We have discussed possibility of colonoscopy for worsening constipation/bowel change. Patient will like to know what her financial responsibilities will be prior to scheduling.

## 2011-07-14 NOTE — Assessment & Plan Note (Signed)
Mostly pelvic pain and left-sided abdominal pain at this point. Continue PPI. Continue to monitor.

## 2011-07-14 NOTE — Patient Instructions (Addendum)
#  1 Your recent CT scan showed pelvic congestion syndrome, nodular liver, scarring and emphysema of the lung bases. #2 Recommend you followup with your gynecologist regarding pelvic congestion syndrome. This may be contributing to your pelvic pain. #3 We will do some blood work to further evaluate for any liver abnormality. We will call you with results. #4 I will review your CT scan with the radiologist and it back in touch with you. #5 We will look into your financial responsibilities for a colonoscopy. If you have not heard from our office within the next one week please call us and check on this. #6 I have sent a prescription for MiraLax to [facility] in [place]. Please start taking daily, hold for diarrhea. You may continue equally stool softener with laxative on an as-needed basis. Hopefully once you have been on MiraLax for a few days you will no longer need stool softener with laxative.

## 2011-07-14 NOTE — Assessment & Plan Note (Signed)
Recommend that she followup with her GYN.

## 2011-07-14 NOTE — Progress Notes (Signed)
Primary Care Physician:  [name], MD  Primary Gastroenterologist:  Roetta Sessions, MD   Chief Complaint  Patient presents with  . Colonoscopy    HPI:  Dawn Hammond is a 45 y.o. female here to reevaluate abdominal pain, discussed CT findings. She was seen on 06/23/2011. At that time she was complaining of new left lower quadrant pain which radiated up into the left abdomen and into the epigastrium. Pain persisted for days or weeks but they go several weeks without any. No correlation with bowel movements or meals. Complains of chronic constipation. Takes stool softener with laxative daily. BM better on equate stool with laxative. May take most days. No brbpr, melena. No heartburn. No dysphagia. No colonoscopy.    Since her last office visit we obtained a copy of lab results from June 2012, ordered by her PCP. Her CBC, LFTs, thyroid all normal. Normal calcium level. I FOBT was negative. CT scan of the abdomen and pelvis show pulmonary scarring and emphysema in the bases. Some lobular area of margins of the liver raising the possibility of developing cirrhosis. Few slightly prominent veins which drain into the gonadal vein on the left, raising the possibility of pelvic congestion syndrome.  Patient notes pelvic pain is more predominant when she lifts or strains. States she has chronic dyspnea on exertion since she had viral pneumonia years ago.   Current Outpatient Prescriptions  Medication Sig Dispense Refill  . estradiol (ESTRACE) 1 MG tablet Take 1 mg by mouth daily.       . famotidine (PEPCID) 20 MG tablet Take 20 mg by mouth 2 (two) times daily.        Marland Kitchen ibuprofen (ADVIL,MOTRIN) 200 MG tablet Take 200 mg by mouth every 6 (six) hours as needed.        . loratadine (CLARITIN) 10 MG tablet Take 10 mg by mouth daily.        Marland Kitchen omeprazole (PRILOSEC) 20 MG capsule Take 20 mg by mouth daily.         Allergies as of 07/14/2011 - Review Complete 07/14/2011  Allergen Reaction Noted  .  Amoxicillin  07/22/2010  . Hydrocodone    . Oxycodone hcl  07/22/2010    Past Medical History  Diagnosis Date  . Pneumonia, viral 1984    x2 tracheostomy/ Collapsed lung  . Impaired hearing   . Chronic sinusitis   . Sleep apnea   . Fibromyalgia   . GERD (gastroesophageal reflux disease)   . Urticaria     Past Surgical History  Procedure Date  . Total abdominal hysterectomy   . Esophagogastroduodenoscopy 08/2010    noncritical schatzki ring s/p dilation, small hh, biopsy negative for EE    Family History  Problem Relation Age of Onset  . Breast cancer Maternal Aunt   . Uterine cancer Maternal Aunt   . Colon cancer Neg Hx     History   Social History  . Marital Status: Married    Spouse Name: N/A    Number of Children: N/A  . Years of Education: N/A   Occupational History  . retired due to hearing impairment    Social History Main Topics  . Smoking status: Never Smoker   . Smokeless tobacco: Not on file  . Alcohol Use: No  . Drug Use: No  . Sexually Active: Not on file   Other Topics Concern  . Not on file   Social History Narrative   stepson      ROS:  General:  Negative for anorexia, weight loss, fever, chills, fatigue, weakness. Eyes: Negative for vision changes.  ENT: Negative for hoarseness, difficulty swallowing , nasal congestion. Chronic hearing loss. CV: Negative for chest pain, angina, palpitations, dyspnea on exertion, peripheral edema.  Respiratory: Negative for dyspnea at rest, dyspnea on exertion, cough, sputum, wheezing.  GI: See history of present illness. GU:  Negative for dysuria, hematuria, urinary incontinence, urinary frequency, nocturnal urination.  MS: Negative for joint pain, low back pain.  Derm: Negative for rash or itching.  Neuro: Negative for weakness, abnormal sensation, seizure, frequent headaches, memory loss, confusion.  Psych: Negative for anxiety, depression, suicidal ideation, hallucinations.  Endo: Negative for  unusual weight change.  Heme: Negative for bruising or bleeding. Allergy: Negative for rash or hives.    Physical Examination:  BP 119/76  Pulse 77  Temp(Src) 98.1 F (36.7 C) (Temporal)  Ht 5\' 4"  (1.626 m)  Wt 155 lb (70.308 kg)  BMI 26.61 kg/m2   General: Well-nourished, well-developed in no acute distress.  Head: Normocephalic, atraumatic.   Eyes: Conjunctiva pink, no icterus. Mouth: Oropharyngeal mucosa moist and pink , no lesions erythema or exudate. Neck: Supple without thyromegaly, masses, or lymphadenopathy.  Lungs: Clear to auscultation bilaterally.  Heart: Regular rate and rhythm, no murmurs rubs or gallops.  Abdomen: Bowel sounds are normal, mild left-sided abdominal tenderness, nondistended, no hepatosplenomegaly or masses, no abdominal bruits or    hernia , no rebound or guarding.   Rectal: Not performed Extremities: No lower extremity edema. No clubbing or deformities.  Neuro: Alert and oriented x 4 , grossly normal neurologically.  Skin: Warm and dry, no rash or jaundice.   Psych: Alert and cooperative, normal mood and affect.  Labs: Lab Results  Component Value Date   WBC 7.0 04/03/2011   HGB 13.5 04/03/2011   HCT 39 04/03/2011   MCV 84.7 04/03/2011      07/11/2008                                         Lab Results  Component Value Date   ALT 15 04/03/2011   AST 17 04/03/2011   ALKPHOS 56 04/03/2011   BILITOT 0.5 04/03/2011     Imaging Studies: Ct Abdomen Pelvis W Contrast  07/06/2011  *RADIOLOGY REPORT*  Clinical Data: Left lower quadrant and epigastric pain.  Previous hysterectomy.  CT ABDOMEN AND PELVIS WITH CONTRAST  Technique:  Multidetector CT imaging of the abdomen and pelvis was performed following the standard protocol during bolus administration of intravenous contrast.  Contrast: OMNIPAQUE IOHEXOL 300 MG/ML IV SOLN  Comparison: None.  Findings: Lung bases show pulmonary scarring and emphysema.  No pleural or pericardial fluid.  There  is some lobular area of the margins of the liver raising the possibility of developing cirrhosis.  No focal lesion or ductal dilatation.  No calcified gallstones.  The spleen is normal.  The pancreas is normal.  The adrenal glands are normal.  The kidneys are normal.  No cyst, mass, stone or hydronephrosis.  The aorta and IVC are normal.  No retroperitoneal mass or adenopathy.  No free intraperitoneal fluid or air.  No bowel pathology noted.  The appendix is normal. Bladder appears normal.  No osseous abnormality.  No visible hernia.  Along the left pelvic sidewall, there are a few slightly prominent veins which drain into the gonadal vein on the left.  It  is conceivable this could relate to pelvic congestion syndrome, but the findings are not pronounced.  IMPRESSION: Pulmonary scarring and emphysema.  Some lobulated the of the margins of the liver raising the possibility of early cirrhosis.  No focal lesion or ductal dilatation.  No acute abdominal or pelvic finding  to explain pain.  A few prominent veins along the left pelvic sidewall draining into the left gonadal vein.  Findings could conceivably relate to pelvic congestion syndrome, but this is not pronounced.  Original Report Authenticated By: Thomasenia Sales, M.D.

## 2011-07-14 NOTE — Assessment & Plan Note (Signed)
Questionable early developing cirrhosis on CT. No clear risk factors for patient. She has some lung disease which she feels is related to her significant viral pneumonia years ago but at this point would consider ruling out alpha 1 antitrypsin deficiency. Also check other labs looking for chronic liver disease.

## 2011-07-15 LAB — COMPREHENSIVE METABOLIC PANEL
AST: 17 U/L (ref 0–37)
Albumin: 4.9 g/dL (ref 3.5–5.2)
Alkaline Phosphatase: 59 U/L (ref 39–117)
BUN: 11 mg/dL (ref 6–23)
Calcium: 9.6 mg/dL (ref 8.4–10.5)
Chloride: 102 mEq/L (ref 96–112)
Glucose, Bld: 71 mg/dL (ref 70–99)
Potassium: 4.3 mEq/L (ref 3.5–5.3)
Sodium: 142 mEq/L (ref 135–145)
Total Protein: 7 g/dL (ref 6.0–8.3)

## 2011-07-15 LAB — PROTIME-INR: INR: 0.9 (ref <1.50)

## 2011-07-15 LAB — ALPHA-1-ANTITRYPSIN: A-1 Antitrypsin, Ser: 147 mg/dL (ref 90–200)

## 2011-07-15 NOTE — Progress Notes (Signed)
Cc to PCP 

## 2011-07-20 ENCOUNTER — Telehealth: Payer: Self-pay | Admitting: Gastroenterology

## 2011-07-20 NOTE — Telephone Encounter (Signed)
Pt called wanting her lab results. She can be reached at [phone]

## 2011-07-20 NOTE — Telephone Encounter (Signed)
Routed to LSL 

## 2011-07-20 NOTE — Progress Notes (Signed)
Quick Note:  Labs ok. No evidence of hemochromatosis, Wilson's disease, or alpha 1 antitrypsin deficiency.  What is status of scheduling TCS. Pt wanted to know what her financial responsibilities are before scheduling and I don't see any documentation about this.  Please let the pt know, no definitive answer regarding possible liver disease yet. I tried to review CT with radiologist last week, but Dr. Tyron Russell was out. Hopefully can review it with him on Thursday. Also will discuss with RMR upon his return next week and then I will call the patient regarding new information. ______

## 2011-07-20 NOTE — Telephone Encounter (Signed)
Please see result note 

## 2011-07-21 ENCOUNTER — Telehealth: Payer: Self-pay | Admitting: Internal Medicine

## 2011-07-21 NOTE — Telephone Encounter (Signed)
Patient called and stated she wanted lab results. She said that she called yesterday an noone returned her call.

## 2011-07-21 NOTE — Telephone Encounter (Signed)
Pt stated she would contact her insurance company to see if she had met her deductible

## 2011-07-21 NOTE — Telephone Encounter (Signed)
Pt aware of results, she said it was ok to schedule tcs.

## 2011-07-22 ENCOUNTER — Other Ambulatory Visit: Payer: Self-pay | Admitting: Gastroenterology

## 2011-07-22 NOTE — Progress Notes (Signed)
Quick Note:  Reviewed films with Dr. Tyron Russell. He did not agree with call for ?early cirrhosis. Only area of ?lobulation seen was on right hepatic margin which appeared to be secondary to ribs/musculature not liver edge.   I will discuss with Dr. Jena Gauss next week. ______

## 2011-07-22 NOTE — Progress Notes (Signed)
PT IS SCHEDULED FOR TCS ON 10/24- INSTRUCTIONS MAILED

## 2011-07-22 NOTE — Progress Notes (Signed)
Quick Note:  Please let pt know. I reviewed her films with radiologist and discussed with Dr. Jena Gauss. Her labs were good. Radiologist does not think there is any issue with her liver.   Consider abd u/s in two years to follow-up. Please NIC.  She does have scaring and emphysema in bases, ?related to previous viral pneumonia but she may consider following up with PCP or pulmonologist given her age.   Colonoscopy as planned. ______

## 2011-07-28 ENCOUNTER — Other Ambulatory Visit: Payer: Self-pay | Admitting: Gastroenterology

## 2011-07-28 DIAGNOSIS — R109 Unspecified abdominal pain: Secondary | ICD-10-CM

## 2011-07-28 MED ORDER — PEG-KCL-NACL-NASULF-NA ASC-C 100 G PO SOLR: 1.0000 | Freq: Once | ORAL | Status: DC

## 2011-08-10 MED ORDER — SODIUM CHLORIDE 0.45 % IV SOLN
Freq: Once | INTRAVENOUS | Status: AC
  Administered 2011-08-11: 09:00:00 via INTRAVENOUS

## 2011-08-11 ENCOUNTER — Ambulatory Visit (HOSPITAL_COMMUNITY)
Admission: RE | Admit: 2011-08-11 | Discharge: 2011-08-11 | Disposition: A | Discharge status: Home / Self Care | Payer: Medicare HMO | Source: Ambulatory Visit | Attending: Internal Medicine | Admitting: Internal Medicine

## 2011-08-11 ENCOUNTER — Encounter (HOSPITAL_COMMUNITY): Payer: Self-pay | Admitting: *Deleted

## 2011-08-11 ENCOUNTER — Encounter (HOSPITAL_COMMUNITY)
Admission: RE | Disposition: A | Discharge status: Home / Self Care | Payer: Self-pay | Source: Ambulatory Visit | Attending: Internal Medicine

## 2011-08-11 DIAGNOSIS — R198 Other specified symptoms and signs involving the digestive system and abdomen: Secondary | ICD-10-CM

## 2011-08-11 DIAGNOSIS — R1032 Left lower quadrant pain: Secondary | ICD-10-CM | POA: Insufficient documentation

## 2011-08-11 HISTORY — DX: Chronic obstructive pulmonary disease, unspecified: J44.9

## 2011-08-11 HISTORY — PX: COLONOSCOPY: SHX5424

## 2011-08-11 SURGERY — COLONOSCOPY
Anesthesia: Moderate Sedation

## 2011-08-11 MED ORDER — MIDAZOLAM HCL 5 MG/5ML IJ SOLN
INTRAMUSCULAR | Status: AC
  Filled 2011-08-11: qty 10

## 2011-08-11 MED ORDER — MIDAZOLAM HCL 5 MG/5ML IJ SOLN
INTRAMUSCULAR | Status: DC | PRN
  Administered 2011-08-11: 2 mg via INTRAVENOUS
  Administered 2011-08-11: 1 mg via INTRAVENOUS

## 2011-08-11 MED ORDER — MEPERIDINE HCL 100 MG/ML IJ SOLN
INTRAMUSCULAR | Status: AC
  Filled 2011-08-11: qty 1

## 2011-08-11 MED ORDER — STERILE WATER FOR IRRIGATION IR SOLN
Status: DC | PRN
  Administered 2011-08-11: 10:00:00

## 2011-08-11 MED ORDER — MEPERIDINE HCL 100 MG/ML IJ SOLN
INTRAMUSCULAR | Status: DC | PRN
  Administered 2011-08-11: 50 mg via INTRAVENOUS
  Administered 2011-08-11: 25 mg via INTRAVENOUS

## 2011-08-11 NOTE — H&P (Signed)
Tana Coast, PA  07/14/2011  4:47 PM  Signed Primary Care Physician:  [name], MD   Primary Gastroenterologist:  Roetta Sessions, MD      Chief Complaint   Patient presents with   .  Colonoscopy      HPI:  Dawn Hammond is a 45 y.o. female here to reevaluate abdominal pain, discussed CT findings. She was seen on 06/23/2011. At that time she was complaining of new left lower quadrant pain which radiated up into the left abdomen and into the epigastrium. Pain persisted for days or weeks but they go several weeks without any. No correlation with bowel movements or meals. Complains of chronic constipation. Takes stool softener with laxative daily. BM better on equate stool with laxative. May take most days. No brbpr, melena. No heartburn. No dysphagia. No colonoscopy.     Since her last office visit we obtained a copy of lab results from June 2012, ordered by her PCP. Her CBC, LFTs, thyroid all normal. Normal calcium level. I FOBT was negative. CT scan of the abdomen and pelvis show pulmonary scarring and emphysema in the bases. Some lobular area of margins of the liver raising the possibility of developing cirrhosis. Few slightly prominent veins which drain into the gonadal vein on the left, raising the possibility of pelvic congestion syndrome.   Patient notes pelvic pain is more predominant when she lifts or strains. States she has chronic dyspnea on exertion since she had viral pneumonia years ago.     Current Outpatient Prescriptions   Medication  Sig  Dispense  Refill   .  estradiol (ESTRACE) 1 MG tablet  Take 1 mg by mouth daily.          .  famotidine (PEPCID) 20 MG tablet  Take 20 mg by mouth 2 (two) times daily.           Marland Kitchen  ibuprofen (ADVIL,MOTRIN) 200 MG tablet  Take 200 mg by mouth every 6 (six) hours as needed.           .  loratadine (CLARITIN) 10 MG tablet  Take 10 mg by mouth daily.           Marland Kitchen  omeprazole (PRILOSEC) 20 MG capsule  Take 20 mg by mouth daily.               Allergies as of 07/14/2011 - Review Complete 07/14/2011   Allergen  Reaction  Noted   .  Amoxicillin    07/22/2010   .  Hydrocodone       .  Oxycodone hcl    07/22/2010       Past Medical History   Diagnosis  Date   .  Pneumonia, viral  1984       x2 tracheostomy/ Collapsed lung   .  Impaired hearing     .  Chronic sinusitis     .  Sleep apnea     .  Fibromyalgia     .  GERD (gastroesophageal reflux disease)     .  Urticaria         Past Surgical History   Procedure  Date   .  Total abdominal hysterectomy     .  Esophagogastroduodenoscopy  08/2010       noncritical schatzki ring s/p dilation, small hh, biopsy negative for EE       Family History   Problem  Relation  Age of Onset   .  Breast cancer  Maternal Aunt     .  Uterine cancer  Maternal Aunt     .  Colon cancer  Neg Hx         History       Social History   .  Marital Status:  Married       Spouse Name:  N/A       Number of Children:  N/A   .  Years of Education:  N/A       Occupational History   .  retired due to hearing impairment         Social History Main Topics   .  Smoking status:  Never Smoker    .  Smokeless tobacco:  Not on file   .  Alcohol Use:  No   .  Drug Use:  No   .  Sexually Active:  Not on file       Other Topics  Concern   .  Not on file       Social History Narrative     stepson        ROS:   General: Negative for anorexia, weight loss, fever, chills, fatigue, weakness. Eyes: Negative for vision changes.   ENT: Negative for hoarseness, difficulty swallowing , nasal congestion. Chronic hearing loss. CV: Negative for chest pain, angina, palpitations, dyspnea on exertion, peripheral edema.   Respiratory: Negative for dyspnea at rest, dyspnea on exertion, cough, sputum, wheezing.   GI: See history of present illness. GU:  Negative for dysuria, hematuria, urinary incontinence, urinary frequency, nocturnal urination.   MS: Negative for joint pain, low back  pain.   Derm: Negative for rash or itching.   Neuro: Negative for weakness, abnormal sensation, seizure, frequent headaches, memory loss, confusion.   Psych: Negative for anxiety, depression, suicidal ideation, hallucinations.   Endo: Negative for unusual weight change.   Heme: Negative for bruising or bleeding. Allergy: Negative for rash or hives.     Physical Examination:   BP 119/76  Pulse 77  Temp(Src) 98.1 F (36.7 C) (Temporal)  Ht 5\' 4"  (1.626 m)  Wt 155 lb (70.308 kg)  BMI 26.61 kg/m2    General: Well-nourished, well-developed in no acute distress.   Head: Normocephalic, atraumatic.    Eyes: Conjunctiva pink, no icterus. Mouth: Oropharyngeal mucosa moist and pink , no lesions erythema or exudate. Neck: Supple without thyromegaly, masses, or lymphadenopathy.   Lungs: Clear to auscultation bilaterally.   Heart: Regular rate and rhythm, no murmurs rubs or gallops.   Abdomen: Bowel sounds are normal, mild left-sided abdominal tenderness, nondistended, no hepatosplenomegaly or masses, no abdominal bruits or    hernia , no rebound or guarding.    Rectal: Not performed Extremities: No lower extremity edema. No clubbing or deformities.   Neuro: Alert and oriented x 4 , grossly normal neurologically.   Skin: Warm and dry, no rash or jaundice.    Psych: Alert and cooperative, normal mood and affect.   Labs: Lab Results   Component  Value  Date     WBC  7.0  04/03/2011     HGB  13.5  04/03/2011     HCT  39  04/03/2011     MCV  84.7  04/03/2011          07/11/2008  Lab Results   Component  Value  Date     ALT  15  04/03/2011     AST  17  04/03/2011     ALKPHOS  56  04/03/2011     BILITOT  0.5  04/03/2011        Imaging Studies: Ct Abdomen Pelvis W Contrast   07/06/2011  *RADIOLOGY REPORT*  Clinical Data: Left lower quadrant and epigastric pain.  Previous hysterectomy.  CT ABDOMEN AND  PELVIS WITH CONTRAST  Technique:  Multidetector CT imaging of the abdomen and pelvis was performed following the standard protocol during bolus administration of intravenous contrast.  Contrast: OMNIPAQUE IOHEXOL 300 MG/ML IV SOLN  Comparison: None.  Findings: Lung bases show pulmonary scarring and emphysema.  No pleural or pericardial fluid.  There is some lobular area of the margins of the liver raising the possibility of developing cirrhosis.  No focal lesion or ductal dilatation.  No calcified gallstones.  The spleen is normal.  The pancreas is normal.  The adrenal glands are normal.  The kidneys are normal.  No cyst, mass, stone or hydronephrosis.  The aorta and IVC are normal.  No retroperitoneal mass or adenopathy.  No free intraperitoneal fluid or air.  No bowel pathology noted.  The appendix is normal. Bladder appears normal. No osseous abnormality.  No visible hernia.  Along the left pelvic sidewall, there are a few slightly prominent veins which drain into the gonadal vein on the left.  It is conceivable this could relate to pelvic congestion syndrome, but the findings are not pronounced.  IMPRESSION: Pulmonary scarring and emphysema.  Some lobulated the of the margins of the liver raising the possibility of early cirrhosis.  No focal lesion or ductal dilatation.  No acute abdominal or pelvic finding  to explain pain.  A few prominent veins along the left pelvic sidewall draining into the left gonadal vein.  Findings could conceivably relate to pelvic congestion syndrome, but this is not pronounced.  Original Report Authenticated By: Thomasenia Sales, M.D.            Glendora Score  07/15/2011  9:27 AM  Signed Cc to PCP  Tana Coast, PA  07/20/2011  1:23 PM  Signed Quick Note:   Labs ok. No evidence of hemochromatosis, Wilson's disease, or alpha 1 antitrypsin deficiency.   What is status of scheduling TCS. Pt wanted to know what her financial responsibilities are before scheduling and  I don't see any documentation about this.   Please let the pt know, no definitive answer regarding possible liver disease yet. I tried to review CT with radiologist last week, but Dr. Tyron Russell was out. Hopefully can review it with him on Thursday. Also will discuss with RMR upon his return next week and then I will call the patient regarding new information. ______  Cherene Julian Dawn  07/22/2011  8:40 AM  Signed PT IS SCHEDULED FOR TCS ON 10/24- INSTRUCTIONS MAILED       Constipation - Tana Coast, PA  07/14/2011  4:43 PM  Signed Chronic constipation, left-sided abdominal pain. Unclear if abdominal pain is secondary to pelvic congestion syndrome, constipation, multifactorial. We have discussed possibility of colonoscopy for worsening constipation/bowel change. Patient will like to know what her financial responsibilities will be prior to scheduling.  Pelvic congestion syndrome - Tana Coast, PA  07/14/2011  4:44 PM  Signed Recommend that she followup with her GYN.  Abnormal CT of liver - Tana Coast, PA  07/14/2011  4:45 PM  Signed Questionable early developing cirrhosis on CT. No clear risk factors for patient. She has some lung disease which she feels is related to her significant viral pneumonia years ago but at this point would consider ruling out alpha 1 antitrypsin deficiency. Also check other labs looking for chronic liver disease.  EPIGASTRIC PAIN - Tana Coast, PA  07/14/2011  4:46 PM  Signed Mostly pelvic pain and left-sided abdominal pain at this point. Continue PPI. Continue to monitor.      I have seen & examined the patient prior to the procedure(s) today and reviewed the history and physical/consultation.  There have been no changes.  After consideration of the risks, benefits, alternatives and imponderables, the patient has consented to the procedure(s).

## 2011-08-18 ENCOUNTER — Encounter (HOSPITAL_COMMUNITY): Payer: Self-pay | Admitting: Internal Medicine

## 2011-08-31 ENCOUNTER — Ambulatory Visit (INDEPENDENT_AMBULATORY_CARE_PROVIDER_SITE_OTHER): Payer: Medicare HMO | Admitting: Internal Medicine

## 2011-08-31 ENCOUNTER — Other Ambulatory Visit (INDEPENDENT_AMBULATORY_CARE_PROVIDER_SITE_OTHER): Payer: Medicare HMO

## 2011-08-31 ENCOUNTER — Encounter: Payer: Self-pay | Admitting: Internal Medicine

## 2011-08-31 ENCOUNTER — Telehealth: Payer: Self-pay | Admitting: Internal Medicine

## 2011-08-31 VITALS — BP 98/54 | HR 113 | Temp 97.5°F | Ht 64.0 in | Wt 152.8 lb

## 2011-08-31 DIAGNOSIS — L509 Urticaria, unspecified: Secondary | ICD-10-CM

## 2011-08-31 DIAGNOSIS — R945 Abnormal results of liver function studies: Secondary | ICD-10-CM

## 2011-08-31 DIAGNOSIS — R932 Abnormal findings on diagnostic imaging of liver and biliary tract: Secondary | ICD-10-CM

## 2011-08-31 LAB — CBC WITH DIFFERENTIAL/PLATELET
Basophils Relative: 0 % (ref 0.0–3.0)
Eosinophils Absolute: 0.1 10*3/uL (ref 0.0–0.7)
Hemoglobin: 14.4 g/dL (ref 12.0–15.0)
Lymphs Abs: 1.3 10*3/uL (ref 0.7–4.0)
MCHC: 34.5 g/dL (ref 30.0–36.0)
MCV: 85.7 fl (ref 78.0–100.0)
Monocytes Absolute: 0.7 10*3/uL (ref 0.1–1.0)
Neutro Abs: 18.2 10*3/uL — ABNORMAL HIGH (ref 1.4–7.7)
RBC: 4.89 Mil/uL (ref 3.87–5.11)

## 2011-08-31 NOTE — Patient Instructions (Signed)
Go ahead with the prednisone as planned  Order- lab- CBC w/ diff, BMET  We will research availability of sensitivity testing for the narcotics, cleocin and titanium as requested, but may not be able to offer those tests.  Test for a1ATdeficiency

## 2011-08-31 NOTE — Telephone Encounter (Signed)
Per CDY, okay to add pt to his scheduled to day at 3:45pm so he can assess rash. Katie aware.

## 2011-08-31 NOTE — Progress Notes (Signed)
08/31/11-45 year old woman walked into the office this afternoon with husband asking allergy evaluation because of rash. She had been seen in 2009 for new onset urticaria starting around 2008. Workup at that time demonstrated no pattern related to food or hormones. Allergy skin testing had shown mild to moderate responses to a number of grass weed and tree pollens, animal danders and dust mite, indicating atopy.  One week ago she had oral surgery for broken tooth. The tooth was extracted with plan for implant. Extraction was done with office anesthesia. Subsequently she began to dilaudid and Cleocin, with Peridex mouth rinse. 2 days ago she developed a rash starting on her back and now confluent over limbs and trunk. She has not had fever, jaw pain, angioedema, joint pain, abdominal cramps or wheezing. She has felt "exhausted". She blames Dilaudid for of nausea with vomiting after a total of 6 tablets, discontinued. She finished the Cleocin 2 days ago. She thinks the Peridex mouthwash made her tongue numb, but without other oral irritation.   Last night she passed out and collapsed on her bedroom floor as she was leaving the bathroom. She revived quickly with husband present. She started Benadryl one week ago. She went to her primary physician, Dr Eduard Clos in [place], who gave her cortisone injection and started prednisone taper. She comes now because of persistent rash. She still feels weak. Itching is controlled.  Recent colonoscopy by Dr. Benard Rink was well-tolerated. She never smoked cigarettes significantly and does not use alcohol. Allergic to penicillin with rash but no prior Cleocin exposure that she is aware of.  ROS-see HPI Constitutional:   No-   weight loss, night sweats, fevers, chills, + fatigue, lassitude. HEENT:   No-  headaches, difficulty swallowing, tooth/dental problems, sore throat,       No-  sneezing, itching, ear ache, nasal congestion, post nasal drip,  CV:  No-   chest pain,  orthopnea, PND, swelling in lower extremities, anasarca,                                  dizziness, palpitations Resp: No-   shortness of breath with exertion or at rest.              No-   productive cough,  No non-productive cough,  No- coughing up of blood.              No-   change in color of mucus.  No- wheezing.   Skin:per HPI GI:  No-   heartburn, indigestion, abdominal pain, nausea is resolved, vomiting, diarrhea,                 change in bowel habits, loss of appetite GU: No-   dysuria, change in color of urine, no urgency or frequency.  No- flank pain. MS:  No-   joint pain or swelling.  No- decreased range of motion.  No- back pain. Neuro-     nothing unusual Psych:  No- change in mood or affect. No depression or anxiety.  No memory loss.  OBJ General- Alert, Oriented, Affect-appropriate, Distress- none acute Skin-  Diffuse macular erythematous rash on trunk and limbs up to neck Lymphadenopathy- none Head- atraumatic            Eyes- Gross vision intact, PERRLA, conjunctivae clear secretions            Ears- Hearing, canals-normal  Nose- Clear, no-Septal dev, mucus, polyps, erosion, perforation             Throat- Mallampati II , mucosa clear , drainage- none, tonsils- atrophic Neck- flexible , trachea midline, no stridor , thyroid nl, carotid no bruit Chest - symmetrical excursion , unlabored           Heart/CV- RRR , no murmur , no gallop  , no rub, nl s1 s2                           - JVD- none , edema- none, stasis changes- none, varices- none           Lung- clear to P&A, wheeze- none, cough- none , dullness-none, rub- none           Chest wall-  Abd- tender-no, distended-no, bowel sounds-present, HSM- no Br/ Gen/ Rectal- Not done, not indicated Extrem- cyanosis- none, clubbing, none, atrophy- none, strength- nl Neuro- grossly intact to observation

## 2011-09-01 ENCOUNTER — Ambulatory Visit: Payer: Medicare HMO | Admitting: Gastroenterology

## 2011-09-01 LAB — BASIC METABOLIC PANEL
BUN: 11 mg/dL (ref 6–23)
Calcium: 9 mg/dL (ref 8.4–10.5)
Creatinine, Ser: 0.9 mg/dL (ref 0.4–1.2)
GFR: 68.34 mL/min (ref >60.00)

## 2011-09-02 ENCOUNTER — Other Ambulatory Visit: Payer: Self-pay | Admitting: Pulmonary Disease

## 2011-09-02 DIAGNOSIS — L509 Urticaria, unspecified: Secondary | ICD-10-CM

## 2011-09-03 NOTE — Assessment & Plan Note (Signed)
Drug rash most likely related to Cleocin. She only stopped the medication 2 days ago so clearing may take a few days. At this time I don't see evidence of anaphylaxis or Stevens-Johnson syndrome. She blames the Dilaudid for nausea which would be consistent with common narcotic side effect. I can't exclude an amnestic response to second exposure of a sedating agent after the colonoscopy and then the dental procedure I think that is less likely.  We have checked with the laboratory. There are no specific sensitivity assays available for Cleocin, Dilaudid or titanium. Any further treatment should avoid use of Cleocin and should use narcotics cautiously. It would be safest to perform procedures in a setting where anesthesia support is available against the possibility of hypotension or respiratory distress.

## 2011-09-03 NOTE — Assessment & Plan Note (Signed)
There was some question of liver abnormality on recent CT scan. She has not used alcohol. Note remote history of pneumothorax with chest tube complicating pneumonia. In this nonsmoker, there is a small possibility that she has combined lung and liver disease associated with Alpha I antitrypsin deficiency. This will be tested but is unlikely.

## 2011-09-07 ENCOUNTER — Telehealth: Payer: Self-pay | Admitting: Internal Medicine

## 2011-09-07 NOTE — Telephone Encounter (Signed)
I spoke with Dawn Hammond and she wanted to know if she could go to the grocery store. She stated Dr. Maple Hudson advised not to go out and to take care of herself. Dawn Hammond then stated she will just send her husband to the grocery store for her. Dawn Hammond needed nothing further

## 2011-09-10 ENCOUNTER — Ambulatory Visit: Payer: Medicare HMO

## 2011-09-10 DIAGNOSIS — L509 Urticaria, unspecified: Secondary | ICD-10-CM

## 2011-09-10 LAB — CBC WITH DIFFERENTIAL/PLATELET
Eosinophils Relative: 1.7 % (ref 0.0–5.0)
Lymphocytes Relative: 26.3 % (ref 12.0–46.0)
MCV: 87.2 fl (ref 78.0–100.0)
Monocytes Absolute: 0.7 10*3/uL (ref 0.1–1.0)
Monocytes Relative: 7.4 % (ref 3.0–12.0)
Neutrophils Relative %: 64.3 % (ref 43.0–77.0)
Platelets: 409 10*3/uL — ABNORMAL HIGH (ref 150.0–400.0)
WBC: 9.6 10*3/uL (ref 4.5–10.5)

## 2011-09-13 NOTE — Progress Notes (Signed)
REVIEWED.  

## 2011-09-16 ENCOUNTER — Telehealth: Payer: Self-pay | Admitting: Internal Medicine

## 2011-09-16 NOTE — Telephone Encounter (Signed)
Called and spoke with pt.  Pt aware of cbc results from 09/10/11.  Informed pt once we get the results of her A1AT we will call her.  Pt verbalized understanding and denied any questions.

## 2011-10-18 ENCOUNTER — Encounter: Payer: Self-pay | Admitting: Internal Medicine

## 2012-01-17 ENCOUNTER — Ambulatory Visit (HOSPITAL_COMMUNITY)
Admission: RE | Admit: 2012-01-17 | Discharge: 2012-01-17 | Disposition: A | Discharge status: Home / Self Care | Payer: Medicare Other | Source: Ambulatory Visit | Attending: Otolaryngology | Admitting: Otolaryngology

## 2012-01-17 DIAGNOSIS — G4733 Obstructive sleep apnea (adult) (pediatric): Secondary | ICD-10-CM | POA: Insufficient documentation

## 2012-02-04 NOTE — Procedures (Signed)
Dawn Hammond, Dawn Hammond          ACCOUNT NO.:  [id number]  MEDICAL RECORD NO.:  [id number]          PATIENT TYPE:  OUT  LOCATION:  SLEEP CENTER                 FACILITY:  [facility]  PHYSICIAN:  Jemuel Laursen A. [name], M.D. DATE OF BIRTH:  16-Jan-1966  DATE OF STUDY:               HOME SLEEP STUDY              REFERRING PHYSICIAN:  Zola Button T. Wolicki, M.D.  HOME SLEEP STUDY  INDICATION FOR STUDY:  This is a 46 year old who presents with snoring and has a known history of mild obstructive sleep apnea syndrome on a previous sleep study.   MEDICATIONS:  Claritin, Prilosec, famotidine. BMI 21.  METHODOLOGY:  This is a home sleep study with efforts using abdominal thoracic belts, nasal airflow or heart rate, and oxygen saturation measured.  SLEEP ARCHITECTURE:  The total recording time is 479 minutes.  RESPIRATORY DATA:  Diagnostic AHI is 6 and low oxygen saturation as read by the computer is 83, but this is actually abrupt cut off, indicating artifact. The true low oxygen saturation is 86.   CARDIAC DATA:  Average heart rate is 75.  IMPRESSIONS-RECOMMENDATIONS:  Mild obstructive sleep apnea syndrome.  Thanks for this referral.     Janayia Burggraf A. [name], M.D.    KAD/MEDQ  D:  02/04/2012 12:52:47  T:  02/04/2012 22:57:45  Job:  [id number]

## 2012-03-01 ENCOUNTER — Encounter (HOSPITAL_BASED_OUTPATIENT_CLINIC_OR_DEPARTMENT_OTHER): Payer: Self-pay | Admitting: *Deleted

## 2012-03-01 NOTE — Progress Notes (Signed)
No labs Hearing impaired- Very mild OSO-no cpap needed

## 2012-03-05 NOTE — H&P (Signed)
Dawn Hammond 46 y.o., female [id number]     Chief Complaint: Nasal Obstruction  HPI: Five or six year return visit.  She has worked with the audiologists  for years related to bilateral hearing loss, possibly from ototoxic antibiotics during one of her pregnancies.  She wears hearing aids on both sides but nevertheless requires direct line of sight to communicate well.   She is here complaining about a poor nasal airway which has some cycling but never really terribly well.  She claims to have occasional sinus infections and some seasonal exposure allergies.  She is currently using Claritin but no nasal sprays.  She did try the Afrin spray at bedtime.  This definitely opened up her nose and helped her to sleep better.  She claims she never does snore and so this did not make any difference.  She may have had some degree of rebound even after a single dose.  She is interested in breathing more freely through her nose.     She does have some globus and phlegm in her throat and takes Prilosec in the morning and Pepcid in the evening.   She has a genuine allergy to clindamycin, and experiences side effects with many of the different narcotics including nausea, rash, and constipation.  PMH: Past Medical History  Diagnosis Date  . Impaired hearing   . Chronic sinusitis   . Fibromyalgia   . GERD (gastroesophageal reflux disease)   . Urticaria   . Pneumonia, viral 1984    x2 tracheostomy/ Collapsed lung  . COPD (chronic obstructive pulmonary disease)     emphysema  . Sleep apnea     does not wear a CPAP-very mild    Surg Hx: Past Surgical History  Procedure Date  . Total abdominal hysterectomy   . Esophagogastroduodenoscopy 08/2010    noncritical schatzki ring s/p dilation, small hh, biopsy negative for EE  . Tracheostomy 1984  . Colonoscopy 08/11/2011    Procedure: COLONOSCOPY;  Surgeon: Corbin Ade, MD;  Location: [facility];  Service: Endoscopy;  Laterality: N/A;   9:45    FHx:   Family History  Problem Relation Age of Onset  . Breast cancer Maternal Aunt   . Uterine cancer Maternal Aunt   . Colon cancer Neg Hx    SocHx:  reports that she has never smoked. She does not have any smokeless tobacco history on file. She reports that she does not drink alcohol or use illicit drugs.  ALLERGIES:  Allergies  Allergen Reactions  . Amoxicillin Rash  . Ascorbic Acid Rash    When taken in high dosages.   . Clindamycin/Lincomycin Rash    Blisters-hr dropped-bp dropped  . Hydrocodone Itching, Nausea And Vomiting and Rash  . Oxycodone Hcl Itching, Nausea And Vomiting and Rash    No prescriptions prior to admission    No results found for this or any previous visit (from the past 48 hour(s)). No results found.  NWG:NFAOZHYQ: Feeling tired (fatigue).  No fever, no night sweats, and no recent weight loss. Head: Headache. Eyes: No eye symptoms. Otolaryngeal: Hearing loss.  No earache.  Tinnitus.  No purulent nasal discharge.  Nasal passage blockage.  No snoring, no sneezing, no hoarseness, and no sore throat. Cardiovascular: No chest pain or discomfort  and no palpitations. Pulmonary: No dyspnea, no cough, and no wheezing. Gastrointestinal: No dysphagia, no heartburn, no nausea, no abdominal pain, and no melena.  No diarrhea. Genitourinary: No dysuria. Endocrine: No muscle weakness. Musculoskeletal: No  calf muscle cramps, no arthralgias, and no soft tissue swelling. Neurological: No dizziness, no fainting, no tingling, and no numbness. Psychological: No anxiety  and no depression. Skin: No rash.  There were no vitals taken for this visit.  PHYSICAL EXAM: This is a pleasant somewhat heavyset adult white female.  The external nose is slightly rightward deviated concave to the LEFT.  Internally, she has a substantial rightward septal deviation with very large inferior turbinates.  Ears are clear.  Oral cavity and pharynx clear.  Neck unremarkable.     Lungs clear to auscultation.   Heart with regular rate and rhythm and no murmurs.   Abdomen is soft and active.   Extremities of normal configuration.  Neurologic testing symmetric and intact.  Studies Reviewed:   Sleep study dated 22 April showed an apnea -hypopnea index of 6 per hour and a low oxygen saturation at 86%.  Oxygen saturation was below 90%  3% of the time.    Assessment/Plan . Deviated nasal septum (acquired)   (470) . Hypertrophied nasal turbinate   (478.0)  We are going to straighten your nasal septum and reduce the nasal turbinates.  This should make your nose more free breathing essentially permanently.  This is usually outpatient surgery.  I recommend ice pack x 24 hrs, head elevated for 3-4 days.  We will give you nasal hygeine instructions when we see you back one day after your surgery for packing removal.  You will need pain medication and antibiotics.  Flo Shanks 03/05/2012, 10:54 PM

## 2012-03-06 ENCOUNTER — Encounter (HOSPITAL_BASED_OUTPATIENT_CLINIC_OR_DEPARTMENT_OTHER): Payer: Self-pay | Admitting: Anesthesiology

## 2012-03-06 ENCOUNTER — Ambulatory Visit (HOSPITAL_BASED_OUTPATIENT_CLINIC_OR_DEPARTMENT_OTHER): Payer: Medicare Other | Admitting: Anesthesiology

## 2012-03-06 ENCOUNTER — Ambulatory Visit (HOSPITAL_BASED_OUTPATIENT_CLINIC_OR_DEPARTMENT_OTHER)
Admission: RE | Admit: 2012-03-06 | Discharge: 2012-03-06 | Disposition: A | Discharge status: Home / Self Care | Payer: Medicare Other | Source: Ambulatory Visit | Attending: Otolaryngology | Admitting: Otolaryngology

## 2012-03-06 ENCOUNTER — Encounter (HOSPITAL_BASED_OUTPATIENT_CLINIC_OR_DEPARTMENT_OTHER)
Admission: RE | Disposition: A | Discharge status: Home / Self Care | Payer: Self-pay | Source: Ambulatory Visit | Attending: Otolaryngology

## 2012-03-06 DIAGNOSIS — H9190 Unspecified hearing loss, unspecified ear: Secondary | ICD-10-CM | POA: Insufficient documentation

## 2012-03-06 DIAGNOSIS — J449 Chronic obstructive pulmonary disease, unspecified: Secondary | ICD-10-CM | POA: Insufficient documentation

## 2012-03-06 DIAGNOSIS — J4489 Other specified chronic obstructive pulmonary disease: Secondary | ICD-10-CM | POA: Insufficient documentation

## 2012-03-06 DIAGNOSIS — K219 Gastro-esophageal reflux disease without esophagitis: Secondary | ICD-10-CM | POA: Insufficient documentation

## 2012-03-06 DIAGNOSIS — G4730 Sleep apnea, unspecified: Secondary | ICD-10-CM | POA: Insufficient documentation

## 2012-03-06 DIAGNOSIS — K449 Diaphragmatic hernia without obstruction or gangrene: Secondary | ICD-10-CM | POA: Insufficient documentation

## 2012-03-06 DIAGNOSIS — J329 Chronic sinusitis, unspecified: Secondary | ICD-10-CM | POA: Insufficient documentation

## 2012-03-06 DIAGNOSIS — J343 Hypertrophy of nasal turbinates: Secondary | ICD-10-CM | POA: Insufficient documentation

## 2012-03-06 DIAGNOSIS — J342 Deviated nasal septum: Secondary | ICD-10-CM | POA: Insufficient documentation

## 2012-03-06 HISTORY — DX: Other specified postprocedural states: R11.2

## 2012-03-06 HISTORY — DX: Other specified postprocedural states: Z98.890

## 2012-03-06 HISTORY — PX: NASAL SEPTOPLASTY W/ TURBINOPLASTY: SHX2070

## 2012-03-06 LAB — POCT HEMOGLOBIN-HEMACUE: Hemoglobin: 13.9 g/dL (ref 12.0–15.0)

## 2012-03-06 SURGERY — SEPTOPLASTY, NOSE, WITH NASAL TURBINATE REDUCTION
Anesthesia: General | Site: Nose | Laterality: Bilateral | Wound class: Clean Contaminated

## 2012-03-06 MED ORDER — METOCLOPRAMIDE HCL 5 MG/ML IJ SOLN
10.0000 mg | Freq: Four times a day (QID) | INTRAMUSCULAR | Status: DC | PRN
  Administered 2012-03-06: 10 mg via INTRAVENOUS

## 2012-03-06 MED ORDER — FENTANYL CITRATE 0.05 MG/ML IJ SOLN
INTRAMUSCULAR | Status: DC | PRN
  Administered 2012-03-06 (×2): 50 ug via INTRAVENOUS

## 2012-03-06 MED ORDER — DROPERIDOL 2.5 MG/ML IJ SOLN
INTRAMUSCULAR | Status: DC | PRN
  Administered 2012-03-06: 0.625 mg via INTRAVENOUS

## 2012-03-06 MED ORDER — CEFAZOLIN SODIUM 1-5 GM-% IV SOLN: 1.0000 g | INTRAVENOUS | Status: DC

## 2012-03-06 MED ORDER — ONDANSETRON HCL 4 MG/2ML IJ SOLN: 4.0000 mg | INTRAMUSCULAR | Status: DC | PRN

## 2012-03-06 MED ORDER — ONDANSETRON HCL 4 MG PO TABS: 4.0000 mg | ORAL_TABLET | ORAL | Status: DC | PRN

## 2012-03-06 MED ORDER — LIDOCAINE-EPINEPHRINE 1 %-1:100000 IJ SOLN
INTRAMUSCULAR | Status: DC | PRN
  Administered 2012-03-06: 16 mL

## 2012-03-06 MED ORDER — SODIUM CHLORIDE 0.9 % IV SOLN: INTRAVENOUS | Status: DC

## 2012-03-06 MED ORDER — BACITRACIN ZINC 500 UNIT/GM EX OINT: 1.0000 "application " | TOPICAL_OINTMENT | Freq: Three times a day (TID) | CUTANEOUS | Status: DC

## 2012-03-06 MED ORDER — ACETAMINOPHEN 10 MG/ML IV SOLN
1000.0000 mg | Freq: Once | INTRAVENOUS | Status: AC
  Administered 2012-03-06: 1000 mg via INTRAVENOUS

## 2012-03-06 MED ORDER — PROPOFOL 10 MG/ML IV EMUL
INTRAVENOUS | Status: DC | PRN
  Administered 2012-03-06: 250 mg via INTRAVENOUS

## 2012-03-06 MED ORDER — SUCCINYLCHOLINE CHLORIDE 20 MG/ML IJ SOLN
INTRAMUSCULAR | Status: DC | PRN
  Administered 2012-03-06: 100 mg via INTRAVENOUS

## 2012-03-06 MED ORDER — COCAINE HCL POWD
Status: DC | PRN
  Administered 2012-03-06: 200 mg via NASAL

## 2012-03-06 MED ORDER — OXYMETAZOLINE HCL 0.05 % NA SOLN
2.0000 | NASAL | Status: AC
  Administered 2012-03-06 (×2): 2 via NASAL

## 2012-03-06 MED ORDER — DEXAMETHASONE SODIUM PHOSPHATE 4 MG/ML IJ SOLN
INTRAMUSCULAR | Status: DC | PRN
  Administered 2012-03-06: 10 mg via INTRAVENOUS

## 2012-03-06 MED ORDER — MIDAZOLAM HCL 5 MG/5ML IJ SOLN
INTRAMUSCULAR | Status: DC | PRN
  Administered 2012-03-06: 1 mg via INTRAVENOUS

## 2012-03-06 MED ORDER — BACITRACIN-NEOMYCIN-POLYMYXIN 400-5-5000 EX OINT
TOPICAL_OINTMENT | CUTANEOUS | Status: DC | PRN
  Administered 2012-03-06: 1 via TOPICAL

## 2012-03-06 MED ORDER — OXYMETAZOLINE HCL 0.05 % NA SOLN
NASAL | Status: DC | PRN
  Administered 2012-03-06: 1 via NASAL

## 2012-03-06 MED ORDER — ONDANSETRON HCL 4 MG/2ML IJ SOLN
INTRAMUSCULAR | Status: DC | PRN
  Administered 2012-03-06: 4 mg via INTRAVENOUS

## 2012-03-06 MED ORDER — FENTANYL CITRATE 0.05 MG/ML IJ SOLN
25.0000 ug | INTRAMUSCULAR | Status: DC | PRN
  Administered 2012-03-06 (×2): 25 ug via INTRAVENOUS

## 2012-03-06 MED ORDER — LIDOCAINE HCL (CARDIAC) 20 MG/ML IV SOLN
INTRAVENOUS | Status: DC | PRN
  Administered 2012-03-06: 50 mg via INTRAVENOUS

## 2012-03-06 MED ORDER — METOCLOPRAMIDE HCL 5 MG/ML IJ SOLN
10.0000 mg | Freq: Once | INTRAMUSCULAR | Status: AC | PRN
  Administered 2012-03-06: 10 mg via INTRAVENOUS

## 2012-03-06 MED ORDER — LACTATED RINGERS IV SOLN
INTRAVENOUS | Status: DC
  Administered 2012-03-06 (×3): via INTRAVENOUS

## 2012-03-06 SURGICAL SUPPLY — 39 items
AIRWAY NASO PHAR 26FR 6.5 (TUBING)
AIRWAY NASOPHAR 26 6.5 (TUBING) IMPLANT
ATTRACTOMAT 16X20 MAGNETIC DRP (DRAPES) IMPLANT
CANISTER SUCTION 1200CC (MISCELLANEOUS) ×2 IMPLANT
CLOTH BEACON ORANGE TIMEOUT ST (SAFETY) ×2 IMPLANT
COAGULATOR SUCT 8FR VV (MISCELLANEOUS) IMPLANT
COTTONBALL LRG STERILE PKG (GAUZE/BANDAGES/DRESSINGS) ×2 IMPLANT
DECANTER SPIKE VIAL GLASS SM (MISCELLANEOUS) IMPLANT
DEPRESSOR TONGUE BLADE STERILE (MISCELLANEOUS) ×4 IMPLANT
DRSG NASOPORE 8CM (GAUZE/BANDAGES/DRESSINGS) IMPLANT
DRSG TELFA 3X8 NADH (GAUZE/BANDAGES/DRESSINGS) ×2 IMPLANT
ELECT REM PT RETURN 9FT ADLT (ELECTROSURGICAL) ×2
ELECTRODE REM PT RTRN 9FT ADLT (ELECTROSURGICAL) ×1 IMPLANT
GAUZE PACKING FOLDED 2  STR (GAUZE/BANDAGES/DRESSINGS) ×1
GAUZE PACKING FOLDED 2 STR (GAUZE/BANDAGES/DRESSINGS) ×1 IMPLANT
GLOVE BIO SURGEON STRL SZ 6.5 (GLOVE) ×2 IMPLANT
GLOVE ECLIPSE 8.0 STRL XLNG CF (GLOVE) ×4 IMPLANT
GLOVE SKINSENSE NS SZ7.0 (GLOVE) ×1
GLOVE SKINSENSE STRL SZ7.0 (GLOVE) ×1 IMPLANT
GOWN PREVENTION PLUS XLARGE (GOWN DISPOSABLE) IMPLANT
GOWN PREVENTION PLUS XXLARGE (GOWN DISPOSABLE) IMPLANT
NEEDLE HYPO 25X1 1.5 SAFETY (NEEDLE) ×2 IMPLANT
NEEDLE SPNL 25GX3.5 QUINCKE BL (NEEDLE) ×2 IMPLANT
NS IRRIG 1000ML POUR BTL (IV SOLUTION) ×2 IMPLANT
PACK BASIN DAY SURGERY FS (CUSTOM PROCEDURE TRAY) ×2 IMPLANT
PACK ENT DAY SURGERY (CUSTOM PROCEDURE TRAY) ×2 IMPLANT
PATTIES SURGICAL .5 X3 (DISPOSABLE) ×2 IMPLANT
SLEEVE SCD COMPRESS KNEE MED (MISCELLANEOUS) ×2 IMPLANT
SPONGE GAUZE 2X2 8PLY STRL LF (GAUZE/BANDAGES/DRESSINGS) ×2 IMPLANT
SUT CHROMIC 3 0 PS 2 (SUTURE) IMPLANT
SUT CHROMIC 4 0 P 3 18 (SUTURE) ×2 IMPLANT
SUT ETHILON 3 0 PS 1 (SUTURE) ×2 IMPLANT
SUT PDS AB 4-0 P3 18 (SUTURE) ×2 IMPLANT
SUT PLAIN 4 0 ~~LOC~~ 1 (SUTURE) IMPLANT
SUT VIC AB 3-0 FS2 27 (SUTURE) IMPLANT
TOWEL OR 17X24 6PK STRL BLUE (TOWEL DISPOSABLE) ×4 IMPLANT
TRAY DSU PREP LF (CUSTOM PROCEDURE TRAY) ×2 IMPLANT
WATER STERILE IRR 1000ML POUR (IV SOLUTION) ×2 IMPLANT
YANKAUER SUCT BULB TIP NO VENT (SUCTIONS) ×2 IMPLANT

## 2012-03-06 NOTE — Transfer of Care (Signed)
Immediate Anesthesia Transfer of Care Note  Patient: Dawn Hammond  Procedure(s) Performed: Procedure(s) (LRB): NASAL SEPTOPLASTY WITH TURBINATE REDUCTION (Bilateral)  Patient Location: PACU  Anesthesia Type: General  Level of Consciousness: awake, alert  and oriented  Airway & Oxygen Therapy: Patient Spontanous Breathing and Patient connected to face mask oxygen  Post-op Assessment: Report given to PACU RN and Post -op Vital signs reviewed and stable  Post vital signs: Reviewed and stable  Complications: No apparent anesthesia complications

## 2012-03-06 NOTE — Progress Notes (Signed)
Pt placed bilateral hearing aids in ears

## 2012-03-06 NOTE — Interval H&P Note (Signed)
History and Physical Interval Note:  03/06/2012 10:10 AM  Dawn Hammond  has presented today for surgery, with the diagnosis of deviated nasal septum, hypertrophic inferior turbs  The various methods of treatment have been discussed with the patient and family. After consideration of risks, benefits and other options for treatment, the patient has consented to  Procedure(s) (LRB): NASAL SEPTOPLASTY WITH TURBINATE REDUCTION (Bilateral) as a surgical intervention .  The patients' history has been re-reviewed, patient re-examined, no change in status, stable for surgery.  I have re-reviewed the patients' chart and labs.  Questions were answered to the patient's satisfaction.     Flo Shanks

## 2012-03-06 NOTE — Anesthesia Procedure Notes (Signed)
Procedure Name: Intubation Date/Time: 03/06/2012 10:43 AM Performed by: Zenia Resides D Pre-anesthesia Checklist: Patient identified, Emergency Drugs available, Suction available, Patient being monitored and Timeout performed Patient Re-evaluated:Patient Re-evaluated prior to inductionOxygen Delivery Method: Circle System Utilized Preoxygenation: Pre-oxygenation with 100% oxygen Intubation Type: IV induction Ventilation: Mask ventilation without difficulty Laryngoscope Size: Mac and 3 Grade View: Grade II Tube type: Oral Number of attempts: 1 Airway Equipment and Method: stylet and oral airway Placement Confirmation: ETT inserted through vocal cords under direct vision,  positive ETCO2 and breath sounds checked- equal and bilateral Tube secured with: Tape Dental Injury: Teeth and Oropharynx as per pre-operative assessment

## 2012-03-06 NOTE — Anesthesia Postprocedure Evaluation (Signed)
Anesthesia Post Note  Patient: Dawn Hammond  Procedure(s) Performed: Procedure(s) (LRB): NASAL SEPTOPLASTY WITH TURBINATE REDUCTION (Bilateral)  Anesthesia type: General  Patient location: PACU  Post pain: Pain level controlled  Post assessment: Patient's Cardiovascular Status Stable  Last Vitals:  Filed Vitals:   03/06/12 1315  BP: 138/78  Pulse: 73  Temp:   Resp: 16    Post vital signs: Reviewed and stable  Level of consciousness: alert  Complications: No apparent anesthesia complications

## 2012-03-06 NOTE — Discharge Instructions (Signed)
Septoplasty with or without Reduction of Inferior Turbinates Septoplasty is an operation used to straighten the wall which divides the inside of the nose. A trimming or removal (reduction) of the structures on the side wall of the nose (turbinates) may be done to improve the nasal airways. Turbinates help created air turbulence in the nasal passage helping to warm and clean the air. This means you can breathe easier through your nose. This procedure is often done as a same day surgery. This means you can go home the day of the operation if there are no problems. RISKS AND COMPLICATIONS Risks of receiving a medicine that makes the patient sleep (anesthetic):  Blood clots in the legs. Deep blood clots may cause pain and swelling. Rarely part of this clot may break off and go to the lungs. This is called a pulmonary embolus and can be fatal.   A heart attack because of strain on the heart or a stroke while or after being asleep.   Death is possible due to this and any procedure. This is extremely unlikely.   Increased risk in obese people of wound infection, heart and lung complications, and thrombosis.   Increased risk in smokers of wound and chest infections, heart and lung complications, and thrombosis.  Risks of the procedure include:  Bleeding may occur at the time of surgery or in the first couple weeks after surgery. Bleeding after surgery can be treated with packing of the nose under local anesthesia. It may require another operation to stop the bleeding. Rarely a blood transfusion may be necessary depending on the amount of blood lost.   Infection can occur. This can be treated with medications which kill germs (antibiotics).   Numbness of the top lip and / or upper front teeth. This may happen if there is injury to nerves.   Adhesions or scar tissue forming inside the nose may require further surgery.   A hole in the wall inside the nose (Septal perforation) can follow a septoplasty.  This does not often cause problems (asymptomatic). It can cause problems such as whistling, crusting or bleeding. This may require another surgery to close the hole.   Impaired or lost sense of taste.   Persistence or recurrence of the original problem. The best done procedure can still have a poor result.   May cause increase in snoring or sleep disturbance.   Changes in the external appearance of the nose may rarely occur and if unsatisfactory may require corrective surgery.   Septal hematoma or abscess can occur. This may have to be drained.  PROCEDURE Before surgery you are given medicine to help you relax. An intravenous tube is started to give medicine. A specialist helps you go to sleep for the procedure. You will be completely asleep for the operation and will not feel or remember anything. After surgery, you will be taken to the recovery area where a nurse will watch and check your progress. Once you're awake, stable, and taking fluids well, barring other problems you will be allowed to go home. HOME CARE INSTRUCTIONS  After the operation, it is normal to have mild headaches, a stuffy nose or bloody discharge from the nose. There may be sneezing and a plugged or full sensation in the ears.   There will be splints in your nose after the surgery that will be removed after one week.   There may be dissolvable material in your nose if inferior turbinate resection was performed. This was put in to  help reduce bleeding. Some of this material may fall out like a clot. This is normal and not a cause to worry.   Do not use ibuprofen (Advil or Motrin) or aspirin or any products containing these medications for the first two weeks following surgery. This can increase the possibility of bleeding. Only take over-the-counter or prescription medicines for pain, discomfort, or fever as directed by your caregiver. Finish all antibiotics if given.   You may clean your nostril with an  over-the-counter nasal saline spray. This will help clear the crusts and blood clots in your nose.   You may also make your own saline solution by mixing the following:    tsp of salt.    tsp of baking soda.   6 oz ( ) of water.   If the saline solution is too irritating, you may dilute it with more water.   Do not blow your nose for 2 weeks after surgery.   Avoid strenuous activity such as heavy lifting (more than 10 pounds), running or sports for 2 weeks. This can cause nosebleeds.   Elevate your head on a couple pillows when sleeping or lying down.   Eat a well balanced diet with plenty of fiber to keep your stools soft. This avoids straining which can cause a nosebleed. Pain medicine may constipate. You may take one tablespoon of Metamucil twice a day in a full glass of water or can take a tablespoon of mineral oil twice a day to help keep your stools soft.   Make and keep follow-up appointments with your surgeon as directed.   When your splints have been removed and the nasal cavities are sprayed and cleaned, apply antibacterial ointment (Neosporin, polysporin) to the inside of your nose with a Q-tip. This should be done twice a day for the next 2 weeks after surgery or as directed.  SEEK MEDICAL CARE IF:  You develop redness, swelling, or increasing pain in your nose.   There is pus-like (purulent) discharge coming from your nose.   An unexplained oral temperature above 102 F (38.9 C) develops.   You develop any visual changes or marked swelling of the eyes.   You have a severe headache or neck stiffness.   You have severe diarrhea.   You have rapid bleeding from your nose.   You have a complete blockage of breathing after the nasal splints are removed.   You develop dizzy episodes or fainting while standing or develop shortness of breath.   You have an ongoing feeling of being sick to your stomach (nausea) or throwing up (vomiting).  SEEK IMMEDIATE MEDICAL  CARE IF:   You develop a rash.   You have a hard time breathing.   You develop any reaction or side effects to medications given.  Document Released: 06/30/2005 Document Revised: 09/23/2011 Document Reviewed: 02/19/2008 [facility] Patient Information 2012 [place], [place]. Post Anesthesia Home Care Instructions  Activity: Get plenty of rest for the remainder of the day. A responsible adult should stay with you for 24 hours following the procedure.  For the next 24 hours, DO NOT: -Drive a car -Advertising copywriter -Drink alcoholic beverages -Take any medication unless instructed by your physician -Make any legal decisions or sign important papers.  Meals: Start with liquid foods such as gelatin or soup. Progress to regular foods as tolerated. Avoid greasy, spicy, heavy foods. If nausea and/or vomiting occur, drink only clear liquids until the nausea and/or vomiting subsides. Call your physician if vomiting continues.  Special  Instructions/Symptoms: Your throat may feel dry or sore from the anesthesia or the breathing tube placed in your throat during surgery. If this causes discomfort, gargle with warm salt water. The discomfort should disappear within 24 hours.

## 2012-03-06 NOTE — Anesthesia Preprocedure Evaluation (Signed)
Anesthesia Evaluation  Patient identified by MRN, date of birth, ID band Patient awake    Reviewed: Allergy & Precautions, H&P , NPO status , Patient's Chart, lab work & pertinent test results, reviewed documented beta blocker date and time   Airway Mallampati: II TM Distance: >3 FB Neck ROM: full    Dental   Pulmonary sleep apnea , pneumonia , COPD         Cardiovascular negative cardio ROS      Neuro/Psych  Neuromuscular disease negative neurological ROS  negative psych ROS   GI/Hepatic Neg liver ROS, hiatal hernia, GERD-  Medicated and Controlled,  Endo/Other  negative endocrine ROS  Renal/GU negative Renal ROS  negative genitourinary   Musculoskeletal   Abdominal   Peds  Hematology negative hematology ROS (+)   Anesthesia Other Findings See surgeon's H&P   Reproductive/Obstetrics negative OB ROS                           Anesthesia Physical Anesthesia Plan  ASA: III  Anesthesia Plan: General   Post-op Pain Management:    Induction: Intravenous  Airway Management Planned: Oral ETT  Additional Equipment:   Intra-op Plan:   Post-operative Plan: Extubation in OR  Informed Consent: I have reviewed the patients History and Physical, chart, labs and discussed the procedure including the risks, benefits and alternatives for the proposed anesthesia with the patient or authorized representative who has indicated his/her understanding and acceptance.   Dental Advisory Given  Plan Discussed with: CRNA and Surgeon  Anesthesia Plan Comments:         Anesthesia Quick Evaluation

## 2012-03-06 NOTE — Op Note (Signed)
03/06/2012  12:01 PM    Janelle Floor  [id number]   Pre-Op Dx:  Deviated Nasal Septum, Hypertrophic Inferior Turbinates  Post-op Dx: Same  Proc: Nasal Septoplasty, Bilateral SMR Inferior Turbinates   Surg:  Flo Shanks T MD  Anes:  GOT  EBL:  25 ml  Comp:  none  Findings:  RIGHT bowed septum with heavy maxillary crest spurring into low LEFT nose.  Bulky inferior turbinates.  Procedure: With the patient in a comfortable supine position,  general orotracheal anesthesia was induced without difficulty.     The patient received preoperative Afrin spray for topical decongestion and vasoconstriction.  Intravenous prophylactic antibiotics were administered.  At an appropriate level, the patient was placed in a semi-sitting position.  A saline moistened throat pack was placed.  Nasal vibrissae were trimmed.  Cocaine crystals, 200 mg total,  were applied on cotton carriers to the anterior ethmoid and sphenopalatine ganglion regions on both sides.  Afrin solution was applied on 0.5" x 3" cottonoids to both sides of the septal mucosa.   1% Xylocaine with 1:100,000 epinephrine, 12 cc's, was infiltrated into the anterior floor of the nose, into the nasal spine region, into the membranous columella, and finally into the submucoperichondrial plane of the septum on both sides.  Several minutes were allowed for this to take effect.  A sterile preparation and draping of the midface was accomplished in the standard fashion.  The materials were removed from the nose and observed to be intact and correct in number.  The nose was inspected with a headlight with the findings as described above.  A LEFT hemitransfixion incision was sharply executed and carried down to the caudal edge of the quadrangular cartilage and continued to a floor incision.  An opposite small floor incision was sharply executed as well.   Floor tunnels were elevated on both sides, carried posteriorly, then medially, then  brought forward along the vomer and maxillary crest.  The submucoperichondrial plane of the  LEFT septum was dissected up to the dorsum of the nose, back onto the perpendicular plate, and brought down and communicated with a floor tunnel and then forward along the maxillary crest.  The flap was generated intact.  The chondroethmoid junction was identified and opened with a Risk analyst.  The opposite submucoperiosteal plane of the perpendicular plate of the ethmoid  was elevated and carried down to the floor tunnel posteriorly.  The superior perpendicular plate was lysed with an open Jansen-Middleton forceps.  The inferior portion was dissected from the maxillary crest and vomer with a Cottle elevator.  The midportion was rocked free with a closed [employer] forceps and then delivered.  At the junction of the quadrangular cartilage and the perpendicular plate superiorly, careful separation allowed the septum to trapdoor into the midline, but there was slight dorsal collapse.  The posterior inferior corner of the quadrangular cartilage was submucosally resected, including a cartilaginous tail up along the vomer.     The maxillary crest posterior to the caudal strut was lowered with mallet and osteotome. A 2 mm wide strip of the inferior caudal strut was resected to allow the septum to come into the midline.  The septum was brought up to the dorsum of the nose at the nasion using a single 4-0 PDS suture externally through the skin. The caudal strut of the quadrangular cartilage was secured to the nasal spine with the same material in a figure of eight configuration. Finally, a third stitch of the same material  was applied between the lower ends of the medial crurae of the lower lateral cartilages to narrow the columella.      The septal tunnel was suctioned clear.  Hemostasis was observed.  The flaps were laid back down.  The incisions were closed with interrupted 4-0 chromic suture.  Just prior  to completing the septoplasty, the inferior turbinates were each infiltrated with additional 1% Xylocaine with 1:100,000 epinephrine,  6 cc's total.  Upon completing the septoplasty, beginning on the RIGHT side, the inferior turbinate was inspected and infractured.  The anterior hood of the inferior turbinate was sharply lysed just behind the nasal valve.  The medial mucosa of the inferior turbinate was incised in an  anterior upsloping fashion and a laterally based flap was developed from the turbinate bone.  Using angled turbinate scissors, turbinate bone and lateral mucosa were resected in a posterior downsloping fashion, taking much of the anterior pole and leaving all of the posterior pole.  Bony spicules were submucosally dissected and removed.  The mucosal flap was laid back down and the turbinate was outfractured.  This completed one SMR inferior turbinate.  The opposite side was performed in identical fashion. The cut mucosal edges of the turbinates were controlled with suction cautery.  Again hemostasis was observed.  After completing both turbinate resections, 0.040" reinforced Silastic splints were fashioned, placed against the nasal septum for support, and secured thereto with a 3-0 Ethilon stitch.   Telfa packs impregnated with bacitracin ointment were placed between the septum and the inferior turbinates, one on each side, for hemostasis and support.  Hemostasis was observed.   At this point the procedure was completed.  The pharynx was suctioned free and the throat pack was removed.   The patient was returned to anesthesia, awakened, extubated, and transferred to recovery in stable condition.  Dispo:   PACU to home  Plan: Ice, elevation, narcotic analgesia, prophylactic antibiotics for the duration of indwelling nasal foreign bodies.  We will remove the nasal packing In one day, the septal splints in 10 days.  Return to work or school in 10 days, strenuous activities in two  weeks.  Cephus Richer MD

## 2012-03-07 ENCOUNTER — Encounter (HOSPITAL_BASED_OUTPATIENT_CLINIC_OR_DEPARTMENT_OTHER): Payer: Self-pay | Admitting: Otolaryngology

## 2013-05-31 ENCOUNTER — Encounter: Payer: Self-pay | Admitting: Gastroenterology

## 2013-05-31 ENCOUNTER — Telehealth: Payer: Self-pay | Admitting: Gastroenterology

## 2013-05-31 NOTE — Telephone Encounter (Signed)
Letter has been mailed to the patient 

## 2013-05-31 NOTE — Telephone Encounter (Signed)
Message copied by Glendora Score on Thu May 31, 2013  4:07 PM ------      Message from: Tonye Pearson      Created: Thu May 31, 2013  3:39 PM       Pre recall list need abd [place]                        Thanks            Ginger  ------

## 2013-09-05 ENCOUNTER — Other Ambulatory Visit: Payer: Self-pay | Admitting: Dermatology

## 2013-10-01 ENCOUNTER — Encounter: Payer: Self-pay | Admitting: Obstetrics & Gynecology

## 2013-10-18 HISTORY — PX: APPENDECTOMY: SHX54

## 2015-05-28 ENCOUNTER — Other Ambulatory Visit: Payer: Self-pay

## 2015-05-28 DIAGNOSIS — Z1231 Encounter for screening mammogram for malignant neoplasm of breast: Secondary | ICD-10-CM

## 2015-07-10 ENCOUNTER — Other Ambulatory Visit: Payer: Self-pay | Admitting: Internal Medicine

## 2015-07-10 ENCOUNTER — Ambulatory Visit
Admission: RE | Admit: 2015-07-10 | Discharge: 2015-07-10 | Disposition: A | Discharge status: Home / Self Care | Payer: Medicare Other | Source: Ambulatory Visit

## 2015-07-10 DIAGNOSIS — Z1231 Encounter for screening mammogram for malignant neoplasm of breast: Secondary | ICD-10-CM

## 2015-07-10 DIAGNOSIS — R928 Other abnormal and inconclusive findings on diagnostic imaging of breast: Secondary | ICD-10-CM

## 2015-07-18 ENCOUNTER — Ambulatory Visit
Admission: RE | Admit: 2015-07-18 | Discharge: 2015-07-18 | Disposition: A | Discharge status: Home / Self Care | Payer: Medicare Other | Source: Ambulatory Visit | Attending: Internal Medicine | Admitting: Internal Medicine

## 2015-07-18 ENCOUNTER — Other Ambulatory Visit: Payer: Medicare Other

## 2015-07-18 DIAGNOSIS — R928 Other abnormal and inconclusive findings on diagnostic imaging of breast: Secondary | ICD-10-CM

## 2016-08-11 ENCOUNTER — Other Ambulatory Visit: Payer: Self-pay | Admitting: Internal Medicine

## 2016-08-11 DIAGNOSIS — Z1231 Encounter for screening mammogram for malignant neoplasm of breast: Secondary | ICD-10-CM

## 2016-08-17 ENCOUNTER — Ambulatory Visit
Admission: RE | Admit: 2016-08-17 | Discharge: 2016-08-17 | Disposition: A | Discharge status: Home / Self Care | Payer: Medicare Other | Source: Ambulatory Visit | Attending: Internal Medicine | Admitting: Internal Medicine

## 2016-08-17 DIAGNOSIS — Z1231 Encounter for screening mammogram for malignant neoplasm of breast: Secondary | ICD-10-CM

## 2017-03-07 DIAGNOSIS — Z789 Other specified health status: Secondary | ICD-10-CM | POA: Diagnosis not present

## 2017-03-07 DIAGNOSIS — M461 Sacroiliitis, not elsewhere classified: Secondary | ICD-10-CM | POA: Diagnosis not present

## 2017-03-07 DIAGNOSIS — R6883 Chills (without fever): Secondary | ICD-10-CM | POA: Diagnosis not present

## 2017-03-07 DIAGNOSIS — Z6827 Body mass index (BMI) 27.0-27.9, adult: Secondary | ICD-10-CM | POA: Diagnosis not present

## 2017-03-07 DIAGNOSIS — Z713 Dietary counseling and surveillance: Secondary | ICD-10-CM | POA: Diagnosis not present

## 2017-03-07 DIAGNOSIS — Z299 Encounter for prophylactic measures, unspecified: Secondary | ICD-10-CM | POA: Diagnosis not present

## 2017-03-07 DIAGNOSIS — M549 Dorsalgia, unspecified: Secondary | ICD-10-CM | POA: Diagnosis not present

## 2017-03-07 DIAGNOSIS — E7800 Pure hypercholesterolemia, unspecified: Secondary | ICD-10-CM | POA: Diagnosis not present

## 2017-03-07 DIAGNOSIS — K219 Gastro-esophageal reflux disease without esophagitis: Secondary | ICD-10-CM | POA: Diagnosis not present

## 2017-03-07 DIAGNOSIS — M1990 Unspecified osteoarthritis, unspecified site: Secondary | ICD-10-CM | POA: Diagnosis not present

## 2017-03-10 DIAGNOSIS — Z299 Encounter for prophylactic measures, unspecified: Secondary | ICD-10-CM | POA: Diagnosis not present

## 2017-03-10 DIAGNOSIS — Z713 Dietary counseling and surveillance: Secondary | ICD-10-CM | POA: Diagnosis not present

## 2017-03-10 DIAGNOSIS — Z6827 Body mass index (BMI) 27.0-27.9, adult: Secondary | ICD-10-CM | POA: Diagnosis not present

## 2017-03-10 DIAGNOSIS — H9190 Unspecified hearing loss, unspecified ear: Secondary | ICD-10-CM | POA: Diagnosis not present

## 2017-03-10 DIAGNOSIS — R109 Unspecified abdominal pain: Secondary | ICD-10-CM | POA: Diagnosis not present

## 2017-05-17 DIAGNOSIS — H1031 Unspecified acute conjunctivitis, right eye: Secondary | ICD-10-CM | POA: Diagnosis not present

## 2017-06-30 DIAGNOSIS — Z79899 Other long term (current) drug therapy: Secondary | ICD-10-CM | POA: Diagnosis not present

## 2017-06-30 DIAGNOSIS — E2839 Other primary ovarian failure: Secondary | ICD-10-CM | POA: Diagnosis not present

## 2017-07-05 ENCOUNTER — Other Ambulatory Visit: Payer: Self-pay | Admitting: Internal Medicine

## 2017-07-05 DIAGNOSIS — Z1231 Encounter for screening mammogram for malignant neoplasm of breast: Secondary | ICD-10-CM

## 2017-07-14 DIAGNOSIS — M25519 Pain in unspecified shoulder: Secondary | ICD-10-CM | POA: Diagnosis not present

## 2017-07-14 DIAGNOSIS — Z6828 Body mass index (BMI) 28.0-28.9, adult: Secondary | ICD-10-CM | POA: Diagnosis not present

## 2017-07-14 DIAGNOSIS — Z79899 Other long term (current) drug therapy: Secondary | ICD-10-CM | POA: Diagnosis not present

## 2017-07-14 DIAGNOSIS — M461 Sacroiliitis, not elsewhere classified: Secondary | ICD-10-CM | POA: Diagnosis not present

## 2017-07-14 DIAGNOSIS — Z780 Asymptomatic menopausal state: Secondary | ICD-10-CM | POA: Diagnosis not present

## 2017-07-14 DIAGNOSIS — Z7189 Other specified counseling: Secondary | ICD-10-CM | POA: Diagnosis not present

## 2017-07-14 DIAGNOSIS — M25512 Pain in left shoulder: Secondary | ICD-10-CM | POA: Diagnosis not present

## 2017-07-14 DIAGNOSIS — Z1211 Encounter for screening for malignant neoplasm of colon: Secondary | ICD-10-CM | POA: Diagnosis not present

## 2017-07-14 DIAGNOSIS — Z0000 Encounter for general adult medical examination without abnormal findings: Secondary | ICD-10-CM | POA: Diagnosis not present

## 2017-07-14 DIAGNOSIS — R5383 Other fatigue: Secondary | ICD-10-CM | POA: Diagnosis not present

## 2017-07-14 DIAGNOSIS — E7800 Pure hypercholesterolemia, unspecified: Secondary | ICD-10-CM | POA: Diagnosis not present

## 2017-07-14 DIAGNOSIS — Z1389 Encounter for screening for other disorder: Secondary | ICD-10-CM | POA: Diagnosis not present

## 2017-07-14 DIAGNOSIS — Z299 Encounter for prophylactic measures, unspecified: Secondary | ICD-10-CM | POA: Diagnosis not present

## 2017-08-12 DIAGNOSIS — M7540 Impingement syndrome of unspecified shoulder: Secondary | ICD-10-CM | POA: Diagnosis not present

## 2017-08-12 DIAGNOSIS — E663 Overweight: Secondary | ICD-10-CM | POA: Diagnosis not present

## 2017-08-12 DIAGNOSIS — E7800 Pure hypercholesterolemia, unspecified: Secondary | ICD-10-CM | POA: Diagnosis not present

## 2017-08-12 DIAGNOSIS — Z2821 Immunization not carried out because of patient refusal: Secondary | ICD-10-CM | POA: Diagnosis not present

## 2017-08-12 DIAGNOSIS — M19049 Primary osteoarthritis, unspecified hand: Secondary | ICD-10-CM | POA: Diagnosis not present

## 2017-08-12 DIAGNOSIS — Z299 Encounter for prophylactic measures, unspecified: Secondary | ICD-10-CM | POA: Diagnosis not present

## 2017-08-18 ENCOUNTER — Ambulatory Visit
Admission: RE | Admit: 2017-08-18 | Discharge: 2017-08-18 | Disposition: A | Discharge status: Home / Self Care | Payer: PPO | Source: Ambulatory Visit | Attending: Internal Medicine | Admitting: Internal Medicine

## 2017-08-18 DIAGNOSIS — Z1231 Encounter for screening mammogram for malignant neoplasm of breast: Secondary | ICD-10-CM | POA: Diagnosis not present

## 2017-08-19 DIAGNOSIS — M25512 Pain in left shoulder: Secondary | ICD-10-CM | POA: Diagnosis not present

## 2017-08-19 DIAGNOSIS — R600 Localized edema: Secondary | ICD-10-CM | POA: Diagnosis not present

## 2017-09-05 DIAGNOSIS — Z6828 Body mass index (BMI) 28.0-28.9, adult: Secondary | ICD-10-CM | POA: Diagnosis not present

## 2017-09-05 DIAGNOSIS — H9319 Tinnitus, unspecified ear: Secondary | ICD-10-CM | POA: Diagnosis not present

## 2017-09-05 DIAGNOSIS — M7502 Adhesive capsulitis of left shoulder: Secondary | ICD-10-CM | POA: Diagnosis not present

## 2017-09-05 DIAGNOSIS — Z299 Encounter for prophylactic measures, unspecified: Secondary | ICD-10-CM | POA: Diagnosis not present

## 2017-09-05 DIAGNOSIS — M461 Sacroiliitis, not elsewhere classified: Secondary | ICD-10-CM | POA: Diagnosis not present

## 2017-10-25 DIAGNOSIS — M75112 Incomplete rotator cuff tear or rupture of left shoulder, not specified as traumatic: Secondary | ICD-10-CM | POA: Diagnosis not present

## 2017-10-25 DIAGNOSIS — M7502 Adhesive capsulitis of left shoulder: Secondary | ICD-10-CM | POA: Diagnosis not present

## 2017-10-31 DIAGNOSIS — M7502 Adhesive capsulitis of left shoulder: Secondary | ICD-10-CM | POA: Diagnosis not present

## 2017-10-31 DIAGNOSIS — M75102 Unspecified rotator cuff tear or rupture of left shoulder, not specified as traumatic: Secondary | ICD-10-CM | POA: Diagnosis not present

## 2017-11-02 DIAGNOSIS — M75102 Unspecified rotator cuff tear or rupture of left shoulder, not specified as traumatic: Secondary | ICD-10-CM | POA: Diagnosis not present

## 2017-11-02 DIAGNOSIS — M7502 Adhesive capsulitis of left shoulder: Secondary | ICD-10-CM | POA: Diagnosis not present

## 2017-11-07 DIAGNOSIS — M75102 Unspecified rotator cuff tear or rupture of left shoulder, not specified as traumatic: Secondary | ICD-10-CM | POA: Diagnosis not present

## 2017-11-07 DIAGNOSIS — M7502 Adhesive capsulitis of left shoulder: Secondary | ICD-10-CM | POA: Diagnosis not present

## 2017-11-09 DIAGNOSIS — M75102 Unspecified rotator cuff tear or rupture of left shoulder, not specified as traumatic: Secondary | ICD-10-CM | POA: Diagnosis not present

## 2017-11-09 DIAGNOSIS — M7502 Adhesive capsulitis of left shoulder: Secondary | ICD-10-CM | POA: Diagnosis not present

## 2017-11-14 DIAGNOSIS — M7502 Adhesive capsulitis of left shoulder: Secondary | ICD-10-CM | POA: Diagnosis not present

## 2017-11-14 DIAGNOSIS — M75102 Unspecified rotator cuff tear or rupture of left shoulder, not specified as traumatic: Secondary | ICD-10-CM | POA: Diagnosis not present

## 2017-11-17 DIAGNOSIS — M7502 Adhesive capsulitis of left shoulder: Secondary | ICD-10-CM | POA: Diagnosis not present

## 2017-11-17 DIAGNOSIS — M75102 Unspecified rotator cuff tear or rupture of left shoulder, not specified as traumatic: Secondary | ICD-10-CM | POA: Diagnosis not present

## 2017-11-21 DIAGNOSIS — M75102 Unspecified rotator cuff tear or rupture of left shoulder, not specified as traumatic: Secondary | ICD-10-CM | POA: Diagnosis not present

## 2017-11-21 DIAGNOSIS — M7502 Adhesive capsulitis of left shoulder: Secondary | ICD-10-CM | POA: Diagnosis not present

## 2017-11-23 DIAGNOSIS — M7502 Adhesive capsulitis of left shoulder: Secondary | ICD-10-CM | POA: Diagnosis not present

## 2017-11-23 DIAGNOSIS — M75102 Unspecified rotator cuff tear or rupture of left shoulder, not specified as traumatic: Secondary | ICD-10-CM | POA: Diagnosis not present

## 2017-11-29 DIAGNOSIS — M7502 Adhesive capsulitis of left shoulder: Secondary | ICD-10-CM | POA: Diagnosis not present

## 2017-11-29 DIAGNOSIS — M75112 Incomplete rotator cuff tear or rupture of left shoulder, not specified as traumatic: Secondary | ICD-10-CM | POA: Diagnosis not present

## 2017-12-05 DIAGNOSIS — M75102 Unspecified rotator cuff tear or rupture of left shoulder, not specified as traumatic: Secondary | ICD-10-CM | POA: Diagnosis not present

## 2017-12-05 DIAGNOSIS — M7502 Adhesive capsulitis of left shoulder: Secondary | ICD-10-CM | POA: Diagnosis not present

## 2017-12-07 DIAGNOSIS — M7502 Adhesive capsulitis of left shoulder: Secondary | ICD-10-CM | POA: Diagnosis not present

## 2017-12-07 DIAGNOSIS — M75102 Unspecified rotator cuff tear or rupture of left shoulder, not specified as traumatic: Secondary | ICD-10-CM | POA: Diagnosis not present

## 2017-12-12 DIAGNOSIS — M75102 Unspecified rotator cuff tear or rupture of left shoulder, not specified as traumatic: Secondary | ICD-10-CM | POA: Diagnosis not present

## 2017-12-12 DIAGNOSIS — M7502 Adhesive capsulitis of left shoulder: Secondary | ICD-10-CM | POA: Diagnosis not present

## 2017-12-14 DIAGNOSIS — M75102 Unspecified rotator cuff tear or rupture of left shoulder, not specified as traumatic: Secondary | ICD-10-CM | POA: Diagnosis not present

## 2017-12-14 DIAGNOSIS — M7502 Adhesive capsulitis of left shoulder: Secondary | ICD-10-CM | POA: Diagnosis not present

## 2017-12-19 DIAGNOSIS — M75102 Unspecified rotator cuff tear or rupture of left shoulder, not specified as traumatic: Secondary | ICD-10-CM | POA: Diagnosis not present

## 2017-12-19 DIAGNOSIS — M7502 Adhesive capsulitis of left shoulder: Secondary | ICD-10-CM | POA: Diagnosis not present

## 2017-12-21 DIAGNOSIS — M75102 Unspecified rotator cuff tear or rupture of left shoulder, not specified as traumatic: Secondary | ICD-10-CM | POA: Diagnosis not present

## 2017-12-21 DIAGNOSIS — M7502 Adhesive capsulitis of left shoulder: Secondary | ICD-10-CM | POA: Diagnosis not present

## 2017-12-26 DIAGNOSIS — M7502 Adhesive capsulitis of left shoulder: Secondary | ICD-10-CM | POA: Diagnosis not present

## 2017-12-26 DIAGNOSIS — M75102 Unspecified rotator cuff tear or rupture of left shoulder, not specified as traumatic: Secondary | ICD-10-CM | POA: Diagnosis not present

## 2017-12-28 DIAGNOSIS — M7502 Adhesive capsulitis of left shoulder: Secondary | ICD-10-CM | POA: Diagnosis not present

## 2017-12-28 DIAGNOSIS — M75102 Unspecified rotator cuff tear or rupture of left shoulder, not specified as traumatic: Secondary | ICD-10-CM | POA: Diagnosis not present

## 2018-01-03 DIAGNOSIS — M7502 Adhesive capsulitis of left shoulder: Secondary | ICD-10-CM | POA: Diagnosis not present

## 2018-01-03 DIAGNOSIS — M75112 Incomplete rotator cuff tear or rupture of left shoulder, not specified as traumatic: Secondary | ICD-10-CM | POA: Diagnosis not present

## 2018-01-31 DIAGNOSIS — Z6828 Body mass index (BMI) 28.0-28.9, adult: Secondary | ICD-10-CM | POA: Diagnosis not present

## 2018-01-31 DIAGNOSIS — M50322 Other cervical disc degeneration at C5-C6 level: Secondary | ICD-10-CM | POA: Diagnosis not present

## 2018-01-31 DIAGNOSIS — Z299 Encounter for prophylactic measures, unspecified: Secondary | ICD-10-CM | POA: Diagnosis not present

## 2018-01-31 DIAGNOSIS — M461 Sacroiliitis, not elsewhere classified: Secondary | ICD-10-CM | POA: Diagnosis not present

## 2018-01-31 DIAGNOSIS — M542 Cervicalgia: Secondary | ICD-10-CM | POA: Diagnosis not present

## 2018-01-31 DIAGNOSIS — R51 Headache: Secondary | ICD-10-CM | POA: Diagnosis not present

## 2018-01-31 DIAGNOSIS — R42 Dizziness and giddiness: Secondary | ICD-10-CM | POA: Diagnosis not present

## 2018-01-31 DIAGNOSIS — G43909 Migraine, unspecified, not intractable, without status migrainosus: Secondary | ICD-10-CM | POA: Diagnosis not present

## 2018-01-31 DIAGNOSIS — M50323 Other cervical disc degeneration at C6-C7 level: Secondary | ICD-10-CM | POA: Diagnosis not present

## 2018-04-12 ENCOUNTER — Other Ambulatory Visit: Payer: Self-pay | Admitting: Internal Medicine

## 2018-04-12 DIAGNOSIS — Z1231 Encounter for screening mammogram for malignant neoplasm of breast: Secondary | ICD-10-CM

## 2018-06-15 DIAGNOSIS — Z6828 Body mass index (BMI) 28.0-28.9, adult: Secondary | ICD-10-CM | POA: Diagnosis not present

## 2018-06-15 DIAGNOSIS — R51 Headache: Secondary | ICD-10-CM | POA: Diagnosis not present

## 2018-06-15 DIAGNOSIS — Z299 Encounter for prophylactic measures, unspecified: Secondary | ICD-10-CM | POA: Diagnosis not present

## 2018-06-15 DIAGNOSIS — E7800 Pure hypercholesterolemia, unspecified: Secondary | ICD-10-CM | POA: Diagnosis not present

## 2018-06-15 DIAGNOSIS — H9190 Unspecified hearing loss, unspecified ear: Secondary | ICD-10-CM | POA: Diagnosis not present

## 2018-06-16 ENCOUNTER — Encounter: Payer: Self-pay | Admitting: Neurology

## 2018-06-30 DIAGNOSIS — H9190 Unspecified hearing loss, unspecified ear: Secondary | ICD-10-CM | POA: Diagnosis not present

## 2018-06-30 DIAGNOSIS — M62830 Muscle spasm of back: Secondary | ICD-10-CM | POA: Diagnosis not present

## 2018-06-30 DIAGNOSIS — Z713 Dietary counseling and surveillance: Secondary | ICD-10-CM | POA: Diagnosis not present

## 2018-06-30 DIAGNOSIS — Z6828 Body mass index (BMI) 28.0-28.9, adult: Secondary | ICD-10-CM | POA: Diagnosis not present

## 2018-06-30 DIAGNOSIS — Z299 Encounter for prophylactic measures, unspecified: Secondary | ICD-10-CM | POA: Diagnosis not present

## 2018-07-19 DIAGNOSIS — Z6828 Body mass index (BMI) 28.0-28.9, adult: Secondary | ICD-10-CM | POA: Diagnosis not present

## 2018-07-19 DIAGNOSIS — Z1339 Encounter for screening examination for other mental health and behavioral disorders: Secondary | ICD-10-CM | POA: Diagnosis not present

## 2018-07-19 DIAGNOSIS — Z299 Encounter for prophylactic measures, unspecified: Secondary | ICD-10-CM | POA: Diagnosis not present

## 2018-07-19 DIAGNOSIS — Z1211 Encounter for screening for malignant neoplasm of colon: Secondary | ICD-10-CM | POA: Diagnosis not present

## 2018-07-19 DIAGNOSIS — Z0000 Encounter for general adult medical examination without abnormal findings: Secondary | ICD-10-CM | POA: Diagnosis not present

## 2018-07-19 DIAGNOSIS — E7800 Pure hypercholesterolemia, unspecified: Secondary | ICD-10-CM | POA: Diagnosis not present

## 2018-07-19 DIAGNOSIS — Z79899 Other long term (current) drug therapy: Secondary | ICD-10-CM | POA: Diagnosis not present

## 2018-07-19 DIAGNOSIS — Z7189 Other specified counseling: Secondary | ICD-10-CM | POA: Diagnosis not present

## 2018-07-19 DIAGNOSIS — R5383 Other fatigue: Secondary | ICD-10-CM | POA: Diagnosis not present

## 2018-07-19 DIAGNOSIS — E559 Vitamin D deficiency, unspecified: Secondary | ICD-10-CM | POA: Diagnosis not present

## 2018-07-19 DIAGNOSIS — Z1331 Encounter for screening for depression: Secondary | ICD-10-CM | POA: Diagnosis not present

## 2018-08-09 ENCOUNTER — Encounter: Payer: Self-pay | Admitting: Neurology

## 2018-08-09 ENCOUNTER — Ambulatory Visit (INDEPENDENT_AMBULATORY_CARE_PROVIDER_SITE_OTHER): Payer: PPO | Admitting: Neurology

## 2018-08-09 VITALS — BP 124/84 | HR 94 | Ht 65.0 in | Wt 166.0 lb

## 2018-08-09 DIAGNOSIS — G43019 Migraine without aura, intractable, without status migrainosus: Secondary | ICD-10-CM

## 2018-08-09 NOTE — Patient Instructions (Signed)
I will review the CT of head.  I will let you know my opinion and whether further testing is needed.  If you wish to treat headaches with medication, let me know when we contact you.

## 2018-08-09 NOTE — Progress Notes (Signed)
NEUROLOGY CONSULTATION NOTE  Dawn Hammond MRN: [id number] DOB: March 18, 1966  Referring provider: Arsenio Katz, NP Primary care provider: Monico Blitz, MD  Reason for consult:  headaches  HISTORY OF PRESENT ILLNESS: Dawn Hammond is a 52 year old woman who presents for headaches.  She is accompanied by her husband who supplements history.  Onset:  2017, worse in late 2018.  At that time, she had vertigo for 5 weeks and then resolved.   Location:  Top of head and radiates to back of head and down the neck.  Right parietal region feels sore. Quality:  Throbbing-pounding Intensity:  Severe.  Not a thunderclap headache. Aura: No Prodrome: No Postdrome: No Associated symptoms:  Nausea, photophobia, phonophobia, not sure if blurry vision.  She denies associated vomiting or unilateral numbness or weakness. Duration:  3 to 7 days, severe for first 1-2 days, then dull for a few days Frequency:  Variable.  She may have 2 or 3 in one week or she may be headache-free for 5 weeks Frequency of abortive medication: As needed Triggers:  Possibly emotional stress Relieving factors:  Sometimes ibuprofen Activity:  Aggravates  X-ray of the cervical spine from 01/31/2018 showed multilevel degenerative disc disease but no acute abnormality.  CT of the head without contrast was personally reviewed and was normal.  Current NSAIDS:  ibuprofen Current analgesics: None Current triptans: None Current ergotamine: None Current anti-emetic: None Current muscle relaxants: None Current anti-anxiolytic: None Current sleep aide: None Current Antihypertensive medications: None Current Antidepressant medications: None Current Anticonvulsant medications: None Current anti-CGRP: None Current Vitamins/Herbal/Supplements:  D3, B-complex Current Antihistamines/Decongestants:  Benadryl, Claritin Other therapy: None Hormone: estradiol  Past NSAIDS: None Past analgesics:  Opioids (intolerable) Past  abortive triptans: None Past abortive ergotamine: None Past muscle relaxants:  Tizanidine 4mg  Past anti-emetic: None Past antihypertensive medications: None Past antidepressant medications: None Past anticonvulsant medications: None Past anti-CGRP: None Past vitamins/Herbal/Supplements: None Past antihistamines/decongestants: None Other past therapies: None  Caffeine:  3 to 4 cups of coffee daily Diet:  Moderate water-intake Exercise:  Not routine Depression:  no; Anxiety:  Some stress Other pain:  Back pain Sleep hygiene:  good Family history of headache:  No  PAST MEDICAL HISTORY: Past Medical History:  Diagnosis Date  . Chronic sinusitis   . COPD (chronic obstructive pulmonary disease) (HCC)    emphysema  . Fibromyalgia   . GERD (gastroesophageal reflux disease)   . Impaired hearing   . Pneumonia, viral 1984   x2 tracheostomy/ Collapsed lung  . PONV (postoperative nausea and vomiting)    after septoplasty surgery  . Sleep apnea    does not wear a CPAP-very mild  . Urticaria     PAST SURGICAL HISTORY: Past Surgical History:  Procedure Laterality Date  . COLONOSCOPY  08/11/2011   Procedure: COLONOSCOPY;  Surgeon: Daneil Dolin, MD;  Location: [facility];  Service: Endoscopy;  Laterality: N/A;  9:45  . ESOPHAGOGASTRODUODENOSCOPY  08/2010   noncritical schatzki ring s/p dilation, small hh, biopsy negative for EE  . NASAL SEPTOPLASTY W/ TURBINOPLASTY  03/06/2012   Procedure: NASAL SEPTOPLASTY WITH TURBINATE REDUCTION;  Surgeon: Jodi Marble, MD;  Location: [facility];  Service: ENT;  Laterality: Bilateral;  . TOTAL ABDOMINAL HYSTERECTOMY    . tracheostomy  1984    MEDICATIONS: Current Outpatient Medications on File Prior to Visit  Medication Sig Dispense Refill  . b complex vitamins tablet Take 1 tablet by mouth daily.      . calcium-vitamin D (OSCAL  WITH D) 500-200 MG-UNIT per tablet Take 1 tablet by mouth daily.      . diphenhydrAMINE  (BENADRYL) 25 MG tablet Take 25 mg by mouth every 6 (six) hours as needed. Hives/Sinus Problems/Congestion     . estradiol (ESTRACE) 1 MG tablet Take 1 mg by mouth daily.     . famotidine (PEPCID) 20 MG tablet Take 20 mg by mouth daily.     Marland Kitchen ibuprofen (ADVIL,MOTRIN) 200 MG tablet Take 200 mg by mouth every 6 (six) hours as needed.      . loratadine (CLARITIN) 10 MG tablet Take 10 mg by mouth 2 (two) times daily.     Marland Kitchen omeprazole (PRILOSEC) 20 MG capsule Take 20 mg by mouth 2 (two) times daily.     Marland Kitchen omeprazole (PRILOSEC) 40 MG capsule Take 40 mg by mouth daily.  0  . tetrahydrozoline-zinc (VISINE-AC) 0.05-0.25 % ophthalmic solution Place 2 drops into both eyes 3 (three) times daily as needed. Dry Eyes     . vitamin E 400 UNIT capsule Take 400 Units by mouth daily.       No current facility-administered medications on file prior to visit.     ALLERGIES: Allergies  Allergen Reactions  . Amoxicillin Rash  . Ascorbic Acid Rash    When taken in high dosages.   . Clindamycin/Lincomycin Rash    Blisters-hr dropped-bp dropped  . Hydrocodone Itching, Nausea And Vomiting and Rash  . Oxycodone Hcl Itching, Nausea And Vomiting and Rash    FAMILY HISTORY: Family History  Problem Relation Age of Onset  . Breast cancer Maternal Aunt   . Uterine cancer Maternal Aunt   . Kidney cancer Mother   . Colon cancer Neg Hx    SOCIAL HISTORY: Social History   Socioeconomic History  . Marital status: Married    Spouse name: Not on file  . Number of children: Not on file  . Years of education: Not on file  . Highest education level: Not on file  Occupational History  . Occupation: retired due to hearing impairment  Social Needs  . Financial resource strain: Not on file  . Food insecurity:    Worry: Not on file    Inability: Not on file  . Transportation needs:    Medical: Not on file    Non-medical: Not on file  Tobacco Use  . Smoking status: Never Smoker  Substance and Sexual Activity  .  Alcohol use: No  . Drug use: No  . Sexual activity: Not on file  Lifestyle  . Physical activity:    Days per week: Not on file    Minutes per session: Not on file  . Stress: Not on file  Relationships  . Social connections:    Talks on phone: Not on file    Gets together: Not on file    Attends religious service: Not on file    Active member of club or organization: Not on file    Attends meetings of clubs or organizations: Not on file    Relationship status: Not on file  . Intimate partner violence:    Fear of current or ex partner: Not on file    Emotionally abused: Not on file    Physically abused: Not on file    Forced sexual activity: Not on file  Other Topics Concern  . Not on file  Social History Narrative   stepson    REVIEW OF SYSTEMS: Constitutional: No fevers, chills, or sweats, no generalized fatigue,  change in appetite Eyes: No visual changes, double vision, eye pain Ear, nose and throat: No hearing loss, ear pain, nasal congestion, sore throat Cardiovascular: No chest pain, palpitations Respiratory:  No shortness of breath at rest or with exertion, wheezes GastrointestinaI: No nausea, vomiting, diarrhea, abdominal pain, fecal incontinence Genitourinary:  No dysuria, urinary retention or frequency Musculoskeletal:  No neck pain, back pain Integumentary: No rash, pruritus, skin lesions Neurological: as above Psychiatric: No depression, insomnia, anxiety Endocrine: No palpitations, fatigue, diaphoresis, mood swings, change in appetite, change in weight, increased thirst Hematologic/Lymphatic:  No purpura, petechiae. Allergic/Immunologic: no itchy/runny eyes, nasal congestion, recent allergic reactions, rashes  PHYSICAL EXAM: Blood pressure 124/84, pulse 94, height 5\' 5"  (1.651 m), weight 166 lb (75.3 kg), SpO2 95 %. General: No acute distress.  Patient appears well-groomed.  Head:  Normocephalic/atraumatic Eyes:  fundi examined but not visualized Neck:  supple, no paraspinal tenderness, full range of motion Back: No paraspinal tenderness Heart: regular rate and rhythm Lungs: Clear to auscultation bilaterally. Vascular: No carotid bruits. Neurological Exam: Mental status: alert and oriented to person, place, and time, recent and remote memory intact, fund of knowledge intact, attention and concentration intact, speech fluent and not dysarthric, language intact. Cranial nerves: CN I: not tested CN II: pupils equal, round and reactive to light, visual fields intact CN III, IV, VI:  full range of motion, no nystagmus, no ptosis CN V: facial sensation intact CN VII: upper and lower face symmetric CN VIII: hearing decreased bilaterally CN IX, X: gag intact, uvula midline CN XI: sternocleidomastoid and trapezius muscles intact CN XII: tongue midline Bulk & Tone: normal, no fasciculations. Motor:  5/5 throughout  Sensation:  temperature and vibration sensation intact. Deep Tendon Reflexes:  2+ throughout, toes downgoing.  Finger to nose testing:  Without dysmetria.  Heel to shin:  Without dysmetria.  Gait:  Normal station and stride.  Able to turn and tandem walk. Romberg negative.  IMPRESSION: Migraine without aura, with status migrainosus, intractable  PLAN: I reviewed the CT of the head, which looks unremarkable.  Her neurologic exam is normal.  At this time, I do not think further testing is needed.  I recommended starting pharmacologic treatment such as preventative migraine medication or abortive medication.  We did talk about topiramate.  At this time she defers medical management.  She will contact me if she changes her mind.  Thank you for allowing me to take part in the care of this patient.  Metta Clines, DO  CC:  Arsenio Katz, NP  Monico Blitz, MD

## 2018-08-21 ENCOUNTER — Ambulatory Visit
Admission: RE | Admit: 2018-08-21 | Discharge: 2018-08-21 | Disposition: A | Discharge status: Home / Self Care | Payer: PPO | Source: Ambulatory Visit | Attending: Internal Medicine | Admitting: Internal Medicine

## 2018-08-21 DIAGNOSIS — Z1231 Encounter for screening mammogram for malignant neoplasm of breast: Secondary | ICD-10-CM | POA: Diagnosis not present

## 2018-08-22 ENCOUNTER — Telehealth: Payer: Self-pay

## 2018-08-22 NOTE — Telephone Encounter (Signed)
-----   Message from Dawn Partridge, DO sent at 08/22/2018  8:49 AM EST ----- Please contact patient to let her know that I reviewed the CT and X-ray imaging and reports.  If she is having soreness of the neck outside of the episodes of headache, then she may benefit from physical therapy for the neck (for cervicalgia)

## 2018-08-22 NOTE — Telephone Encounter (Signed)
Pt called back, advised her of results, she declines P/T at this time.

## 2018-08-22 NOTE — Telephone Encounter (Signed)
LMOVM for Pt to return my call

## 2018-11-27 ENCOUNTER — Other Ambulatory Visit: Payer: Self-pay | Admitting: Dermatology

## 2018-11-27 DIAGNOSIS — D239 Other benign neoplasm of skin, unspecified: Secondary | ICD-10-CM | POA: Diagnosis not present

## 2018-11-27 DIAGNOSIS — D485 Neoplasm of uncertain behavior of skin: Secondary | ICD-10-CM | POA: Diagnosis not present

## 2018-11-27 DIAGNOSIS — D229 Melanocytic nevi, unspecified: Secondary | ICD-10-CM | POA: Diagnosis not present

## 2018-11-27 DIAGNOSIS — D225 Melanocytic nevi of trunk: Secondary | ICD-10-CM | POA: Diagnosis not present

## 2019-01-02 DIAGNOSIS — H903 Sensorineural hearing loss, bilateral: Secondary | ICD-10-CM | POA: Diagnosis not present

## 2019-08-06 DIAGNOSIS — R509 Fever, unspecified: Secondary | ICD-10-CM | POA: Diagnosis not present

## 2019-08-06 DIAGNOSIS — Z1159 Encounter for screening for other viral diseases: Secondary | ICD-10-CM | POA: Diagnosis not present

## 2019-08-06 DIAGNOSIS — Z20828 Contact with and (suspected) exposure to other viral communicable diseases: Secondary | ICD-10-CM | POA: Diagnosis not present

## 2019-08-06 DIAGNOSIS — Z885 Allergy status to narcotic agent status: Secondary | ICD-10-CM | POA: Diagnosis not present

## 2019-08-06 DIAGNOSIS — Z880 Allergy status to penicillin: Secondary | ICD-10-CM | POA: Diagnosis not present

## 2019-08-06 DIAGNOSIS — R05 Cough: Secondary | ICD-10-CM | POA: Diagnosis not present

## 2019-08-06 DIAGNOSIS — R0602 Shortness of breath: Secondary | ICD-10-CM | POA: Diagnosis not present

## 2019-08-06 DIAGNOSIS — R0982 Postnasal drip: Secondary | ICD-10-CM | POA: Diagnosis not present

## 2019-08-06 DIAGNOSIS — R432 Parageusia: Secondary | ICD-10-CM | POA: Diagnosis not present

## 2019-08-13 DIAGNOSIS — E7800 Pure hypercholesterolemia, unspecified: Secondary | ICD-10-CM | POA: Diagnosis not present

## 2019-08-13 DIAGNOSIS — Z6828 Body mass index (BMI) 28.0-28.9, adult: Secondary | ICD-10-CM | POA: Diagnosis not present

## 2019-08-13 DIAGNOSIS — U071 COVID-19: Secondary | ICD-10-CM | POA: Diagnosis not present

## 2019-08-13 DIAGNOSIS — Z299 Encounter for prophylactic measures, unspecified: Secondary | ICD-10-CM | POA: Diagnosis not present

## 2019-08-13 DIAGNOSIS — Z789 Other specified health status: Secondary | ICD-10-CM | POA: Diagnosis not present

## 2019-10-18 DIAGNOSIS — Z299 Encounter for prophylactic measures, unspecified: Secondary | ICD-10-CM | POA: Diagnosis not present

## 2019-10-18 DIAGNOSIS — M1990 Unspecified osteoarthritis, unspecified site: Secondary | ICD-10-CM | POA: Diagnosis not present

## 2019-10-18 DIAGNOSIS — Z6827 Body mass index (BMI) 27.0-27.9, adult: Secondary | ICD-10-CM | POA: Diagnosis not present

## 2019-10-18 DIAGNOSIS — Z1211 Encounter for screening for malignant neoplasm of colon: Secondary | ICD-10-CM | POA: Diagnosis not present

## 2019-10-18 DIAGNOSIS — Z713 Dietary counseling and surveillance: Secondary | ICD-10-CM | POA: Diagnosis not present

## 2019-10-18 DIAGNOSIS — K219 Gastro-esophageal reflux disease without esophagitis: Secondary | ICD-10-CM | POA: Diagnosis not present

## 2019-10-23 IMAGING — MG DIGITAL SCREENING BILATERAL MAMMOGRAM WITH TOMO AND CAD
8 series · 8 of 24 positions shown · non-contrast
Comparison: Previous exam(s).

CLINICAL DATA: Screening.

EXAM:
DIGITAL SCREENING BILATERAL MAMMOGRAM WITH TOMO AND CAD

[R MLO synth-2D]
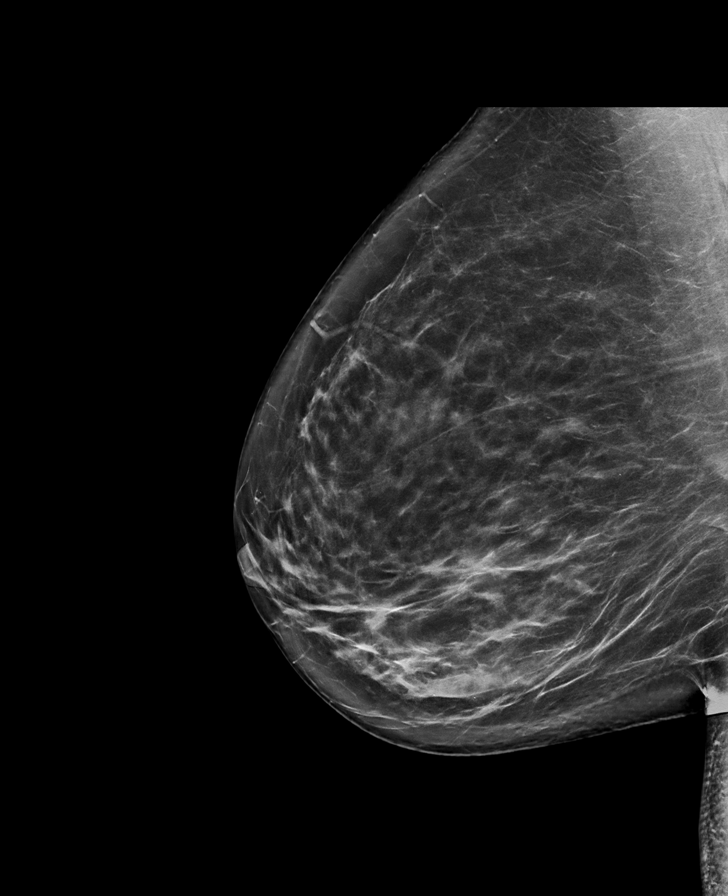

[R CC synth-2D]
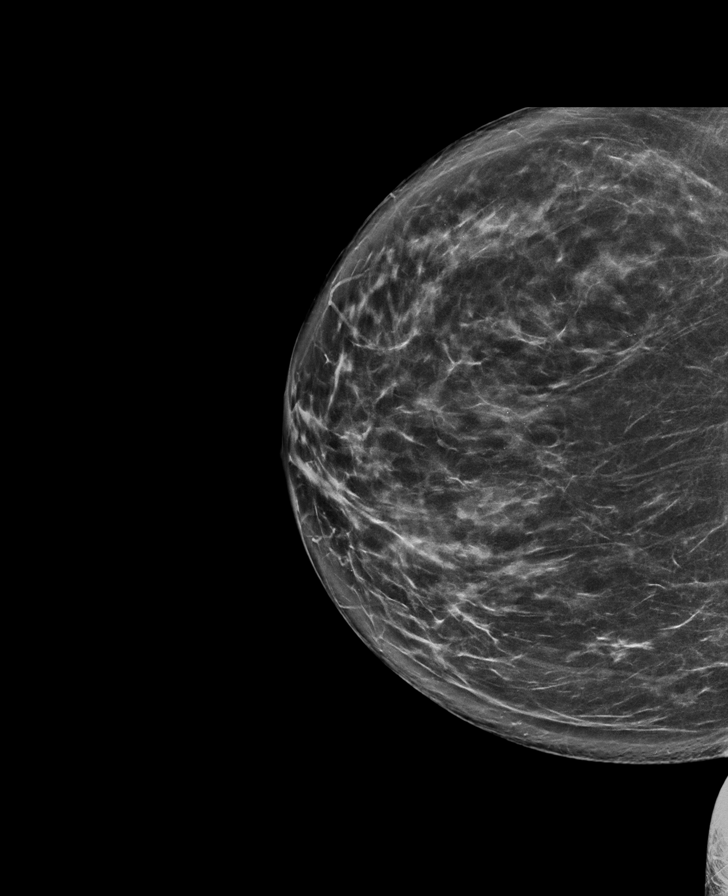

[L CC synth-2D]
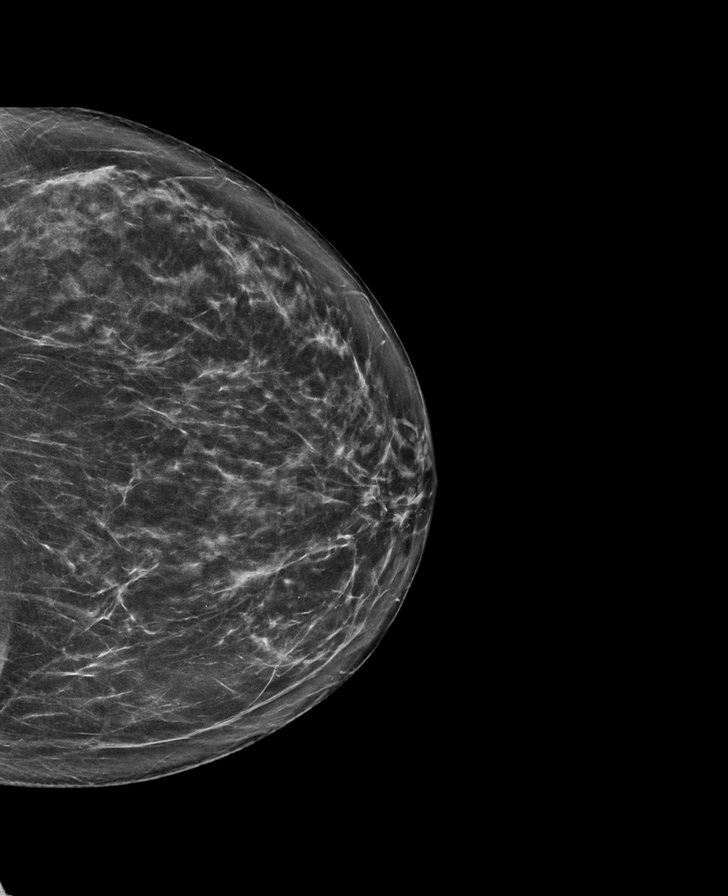

[L MLO synth-2D]
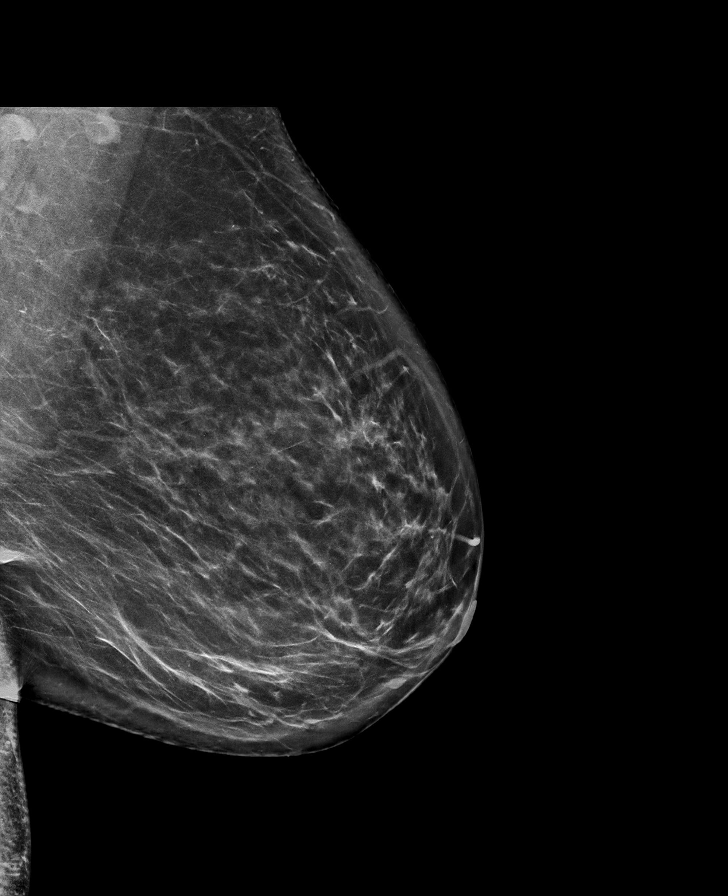

[L MLO tomo · tomo slice 43/86.0]
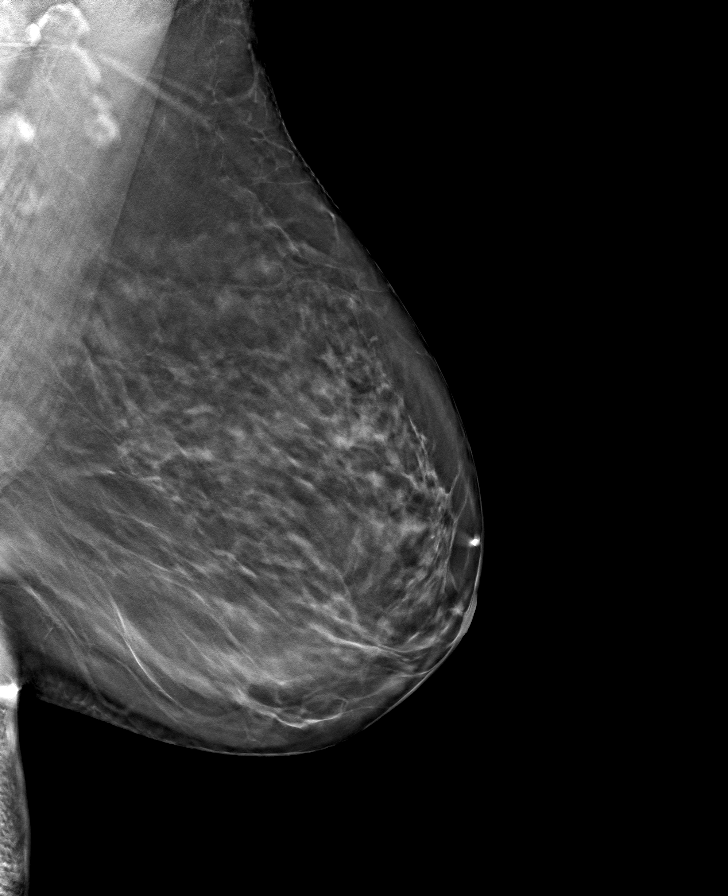

[L CC tomo · tomo slice 37/74.0]
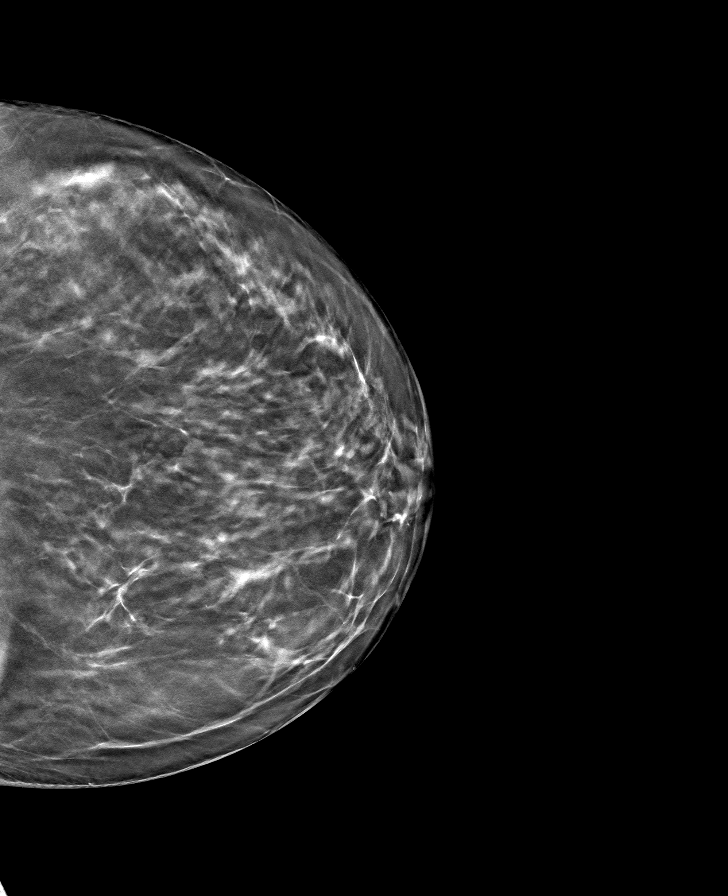

[R MLO tomo · tomo slice 45/89.0]
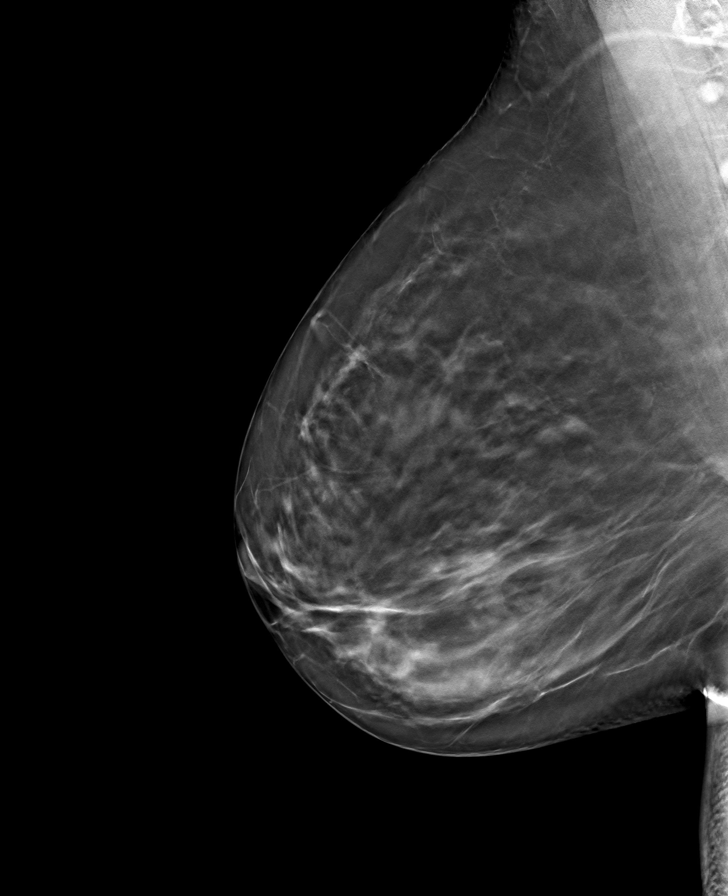

[R CC tomo · tomo slice 39/78.0]
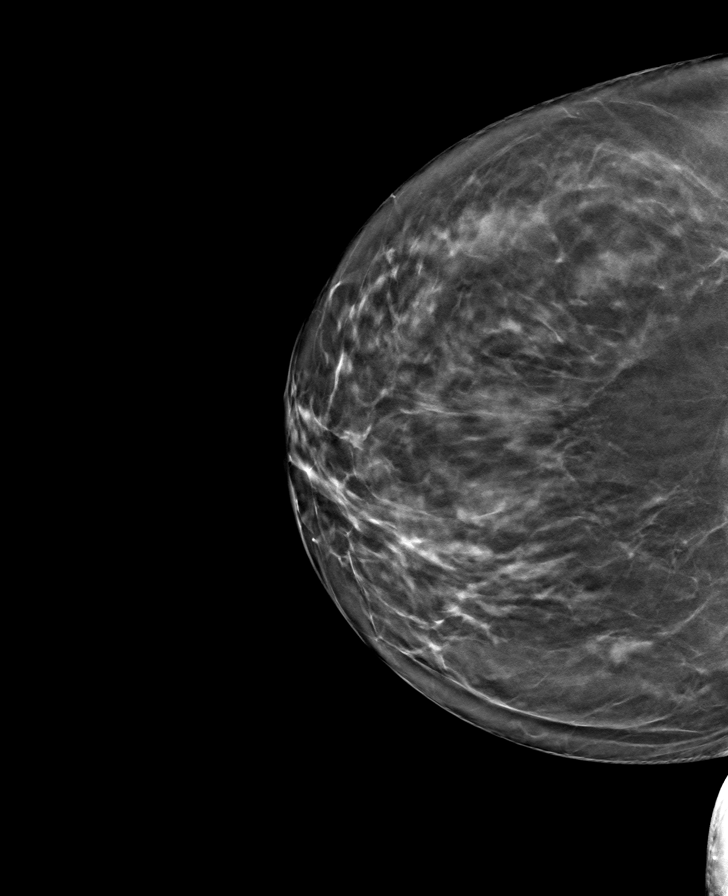

[8 of 24 positions shown; findings below may reference images not displayed]

ACR Breast Density Category c: The breast tissue is heterogeneously
dense, which may obscure small masses.
FINDINGS: There are no findings suspicious for malignancy. Images were
processed with CAD.
IMPRESSION: No mammographic evidence of malignancy. A result letter of this
screening mammogram will be mailed directly to the patient.

RECOMMENDATION:
Screening mammogram in one year. (Code:[id number]-U-LHB)

BI-RADS CATEGORY  1: Negative.

## 2019-11-01 DIAGNOSIS — Z299 Encounter for prophylactic measures, unspecified: Secondary | ICD-10-CM | POA: Diagnosis not present

## 2019-11-01 DIAGNOSIS — Z7189 Other specified counseling: Secondary | ICD-10-CM | POA: Diagnosis not present

## 2019-11-01 DIAGNOSIS — E7800 Pure hypercholesterolemia, unspecified: Secondary | ICD-10-CM | POA: Diagnosis not present

## 2019-11-01 DIAGNOSIS — Z0000 Encounter for general adult medical examination without abnormal findings: Secondary | ICD-10-CM | POA: Diagnosis not present

## 2019-11-01 DIAGNOSIS — Z1211 Encounter for screening for malignant neoplasm of colon: Secondary | ICD-10-CM | POA: Diagnosis not present

## 2019-11-01 DIAGNOSIS — Z789 Other specified health status: Secondary | ICD-10-CM | POA: Diagnosis not present

## 2019-11-01 DIAGNOSIS — K219 Gastro-esophageal reflux disease without esophagitis: Secondary | ICD-10-CM | POA: Diagnosis not present

## 2019-11-01 DIAGNOSIS — Z1339 Encounter for screening examination for other mental health and behavioral disorders: Secondary | ICD-10-CM | POA: Diagnosis not present

## 2019-11-01 DIAGNOSIS — R5383 Other fatigue: Secondary | ICD-10-CM | POA: Diagnosis not present

## 2019-11-01 DIAGNOSIS — Z6827 Body mass index (BMI) 27.0-27.9, adult: Secondary | ICD-10-CM | POA: Diagnosis not present

## 2019-11-01 DIAGNOSIS — Z1331 Encounter for screening for depression: Secondary | ICD-10-CM | POA: Diagnosis not present

## 2019-11-05 DIAGNOSIS — R5383 Other fatigue: Secondary | ICD-10-CM | POA: Diagnosis not present

## 2019-11-05 DIAGNOSIS — Z79899 Other long term (current) drug therapy: Secondary | ICD-10-CM | POA: Diagnosis not present

## 2019-11-05 DIAGNOSIS — E7800 Pure hypercholesterolemia, unspecified: Secondary | ICD-10-CM | POA: Diagnosis not present

## 2019-12-16 DIAGNOSIS — M461 Sacroiliitis, not elsewhere classified: Secondary | ICD-10-CM | POA: Diagnosis not present

## 2019-12-16 DIAGNOSIS — K219 Gastro-esophageal reflux disease without esophagitis: Secondary | ICD-10-CM | POA: Diagnosis not present

## 2020-01-08 ENCOUNTER — Other Ambulatory Visit: Payer: Self-pay | Admitting: Internal Medicine

## 2020-01-08 DIAGNOSIS — Z1231 Encounter for screening mammogram for malignant neoplasm of breast: Secondary | ICD-10-CM

## 2020-01-25 ENCOUNTER — Ambulatory Visit: Payer: PPO

## 2020-02-07 ENCOUNTER — Ambulatory Visit: Payer: PPO

## 2020-04-04 ENCOUNTER — Other Ambulatory Visit: Payer: Self-pay

## 2020-04-04 ENCOUNTER — Ambulatory Visit
Admission: RE | Admit: 2020-04-04 | Discharge: 2020-04-04 | Disposition: A | Discharge status: Home / Self Care | Payer: PPO | Source: Ambulatory Visit | Attending: Internal Medicine | Admitting: Internal Medicine

## 2020-04-04 DIAGNOSIS — Z1231 Encounter for screening mammogram for malignant neoplasm of breast: Secondary | ICD-10-CM

## 2020-04-18 DIAGNOSIS — R519 Headache, unspecified: Secondary | ICD-10-CM | POA: Diagnosis not present

## 2020-04-18 DIAGNOSIS — M549 Dorsalgia, unspecified: Secondary | ICD-10-CM | POA: Diagnosis not present

## 2020-04-18 DIAGNOSIS — Z299 Encounter for prophylactic measures, unspecified: Secondary | ICD-10-CM | POA: Diagnosis not present

## 2020-04-18 DIAGNOSIS — M461 Sacroiliitis, not elsewhere classified: Secondary | ICD-10-CM | POA: Diagnosis not present

## 2020-04-18 DIAGNOSIS — R42 Dizziness and giddiness: Secondary | ICD-10-CM | POA: Diagnosis not present

## 2020-04-18 DIAGNOSIS — M5136 Other intervertebral disc degeneration, lumbar region: Secondary | ICD-10-CM | POA: Diagnosis not present

## 2020-05-02 DIAGNOSIS — M549 Dorsalgia, unspecified: Secondary | ICD-10-CM | POA: Diagnosis not present

## 2020-05-02 DIAGNOSIS — M1990 Unspecified osteoarthritis, unspecified site: Secondary | ICD-10-CM | POA: Diagnosis not present

## 2020-05-02 DIAGNOSIS — Z299 Encounter for prophylactic measures, unspecified: Secondary | ICD-10-CM | POA: Diagnosis not present

## 2020-05-02 DIAGNOSIS — H9190 Unspecified hearing loss, unspecified ear: Secondary | ICD-10-CM | POA: Diagnosis not present

## 2020-05-13 DIAGNOSIS — M545 Low back pain: Secondary | ICD-10-CM | POA: Diagnosis not present

## 2020-05-13 DIAGNOSIS — R262 Difficulty in walking, not elsewhere classified: Secondary | ICD-10-CM | POA: Diagnosis not present

## 2020-05-16 DIAGNOSIS — I10 Essential (primary) hypertension: Secondary | ICD-10-CM | POA: Diagnosis not present

## 2020-05-16 DIAGNOSIS — E785 Hyperlipidemia, unspecified: Secondary | ICD-10-CM | POA: Diagnosis not present

## 2020-05-30 DIAGNOSIS — M549 Dorsalgia, unspecified: Secondary | ICD-10-CM | POA: Diagnosis not present

## 2020-05-30 DIAGNOSIS — E7800 Pure hypercholesterolemia, unspecified: Secondary | ICD-10-CM | POA: Diagnosis not present

## 2020-05-30 DIAGNOSIS — Z713 Dietary counseling and surveillance: Secondary | ICD-10-CM | POA: Diagnosis not present

## 2020-05-30 DIAGNOSIS — M461 Sacroiliitis, not elsewhere classified: Secondary | ICD-10-CM | POA: Diagnosis not present

## 2020-05-30 DIAGNOSIS — Z299 Encounter for prophylactic measures, unspecified: Secondary | ICD-10-CM | POA: Diagnosis not present

## 2020-06-17 DIAGNOSIS — E7849 Other hyperlipidemia: Secondary | ICD-10-CM | POA: Diagnosis not present

## 2020-06-17 DIAGNOSIS — K219 Gastro-esophageal reflux disease without esophagitis: Secondary | ICD-10-CM | POA: Diagnosis not present

## 2020-06-17 DIAGNOSIS — M1990 Unspecified osteoarthritis, unspecified site: Secondary | ICD-10-CM | POA: Diagnosis not present

## 2020-07-08 ENCOUNTER — Ambulatory Visit: Payer: PPO | Admitting: Dermatology

## 2020-07-08 ENCOUNTER — Encounter: Payer: Self-pay | Admitting: Dermatology

## 2020-07-08 ENCOUNTER — Other Ambulatory Visit: Payer: Self-pay

## 2020-07-08 DIAGNOSIS — L309 Dermatitis, unspecified: Secondary | ICD-10-CM | POA: Diagnosis not present

## 2020-07-08 DIAGNOSIS — L308 Other specified dermatitis: Secondary | ICD-10-CM | POA: Diagnosis not present

## 2020-07-08 DIAGNOSIS — L858 Other specified epidermal thickening: Secondary | ICD-10-CM | POA: Diagnosis not present

## 2020-07-08 DIAGNOSIS — Z1283 Encounter for screening for malignant neoplasm of skin: Secondary | ICD-10-CM

## 2020-07-08 MED ORDER — CLOBETASOL PROPIONATE 0.05 % EX FOAM: Freq: Two times a day (BID) | CUTANEOUS | 0 refills | Status: DC

## 2020-07-08 MED ORDER — TRETINOIN 0.025 % EX CREA: TOPICAL_CREAM | Freq: Every evening | CUTANEOUS | 0 refills | Status: AC

## 2020-07-08 NOTE — Progress Notes (Signed)
No location found in the scalp advised to have her hair dresser to mark the spot with sharpe. Ok to follow up in the winter to re check left jaw and right chest

## 2020-07-15 ENCOUNTER — Telehealth: Payer: Self-pay | Admitting: Dermatology

## 2020-07-15 NOTE — Telephone Encounter (Signed)
Cash pay good rx 85 for both

## 2020-07-15 NOTE — Telephone Encounter (Signed)
Patient left message on office voice mail that the 2 medications that she was supposed to get at [facility] in [place], [place] were over $90.00 each.  Patient wants to know if there is anything cheaper.

## 2020-08-07 NOTE — Progress Notes (Signed)
   Follow-Up Visit   Subjective  Dawn Hammond is a 54 y.o. female who presents for the following: Annual Exam (x2 spots in scalp per hair dresser, refill clobetasol and trerinoin & check left jawline).  Annual skin exam Location:  Duration:  Quality:  Associated Signs/Symptoms: Modifying Factors:  Severity:  Timing: Context:   Objective  Well appearing patient in no apparent distress; mood and affect are within normal limits.  A full examination was performed including scalp, head, eyes, ears, nose, lips, neck, chest, axillae, abdomen, back, buttocks, bilateral upper extremities, bilateral lower extremities, hands, feet, fingers, toes, fingernails, and toenails. All findings within normal limits unless otherwise noted below.   Assessment & Plan    No atypical moles or non mole skin cancers. Yearly skin exams.    Encounter for screening for malignant neoplasm of skin Right Breast  Unable to locate spot in scalp- informed patient to have hair dresser mark spot when and if located  Other eczema  Ordered Medications: clobetasol (OLUX) 0.05 % topical foam  Keratosis pilaris (2) Left Upper Arm - Posterior; Right Upper Arm - Posterior  Benign- okay to use tretinoin but no actual cure  tretinoin (RETIN-A) 0.025 % cream - Left Upper Arm - Posterior, Right Upper Arm - Posterior  Eczema, unspecified type Mid Frontal Scalp  Okay to use clobetasol foam -avoid use on lower face or around eyes.     I, [name], MD, have reviewed all documentation for this visit.  The documentation on 08/10/20 for the exam, diagnosis, procedures, and orders are all accurate and complete.

## 2020-08-10 ENCOUNTER — Encounter: Payer: Self-pay | Admitting: Dermatology

## 2020-08-15 DIAGNOSIS — M1990 Unspecified osteoarthritis, unspecified site: Secondary | ICD-10-CM | POA: Diagnosis not present

## 2020-08-15 DIAGNOSIS — K219 Gastro-esophageal reflux disease without esophagitis: Secondary | ICD-10-CM | POA: Diagnosis not present

## 2020-08-15 DIAGNOSIS — E7849 Other hyperlipidemia: Secondary | ICD-10-CM | POA: Diagnosis not present

## 2020-09-03 ENCOUNTER — Ambulatory Visit: Payer: PPO | Admitting: Dermatology

## 2020-09-03 ENCOUNTER — Other Ambulatory Visit: Payer: Self-pay

## 2020-09-03 DIAGNOSIS — L570 Actinic keratosis: Secondary | ICD-10-CM

## 2020-09-03 DIAGNOSIS — Z1283 Encounter for screening for malignant neoplasm of skin: Secondary | ICD-10-CM | POA: Diagnosis not present

## 2020-09-07 ENCOUNTER — Encounter: Payer: Self-pay | Admitting: Dermatology

## 2020-09-07 NOTE — Progress Notes (Signed)
° °  Follow-Up Visit   Subjective  Dawn Hammond is a 54 y.o. female who presents for the following: Skin Problem (Check lesion on right breast. x 3-4 weeks. Came up like a bump. Does have a texture to it. No pain not much itch right now. tx none. ).  Growth Location: Right chest Duration: Less than 100 and Quality: Initially abundant but no scaling Associated Signs/Symptoms: Modifying Factors:  Severity:  Timing: Context:   Objective  Well appearing patient in no apparent distress; mood and affect are within normal limits.  All skin waist up examined.   Assessment & Plan    Encounter for screening for malignant neoplasm of skin Right Breast  Yearly skin check  Lichenoid actinic keratosis Right Breast  Recheck in 1 month-may cancel if spot is clear.  Destruction of lesion - Right Breast Complexity: simple   Destruction method: cryotherapy   Informed consent: discussed and consent obtained   Lesion destroyed using liquid nitrogen: Yes   Cryotherapy cycles:  5 Outcome: patient tolerated procedure well with no complications        I, [name], MD, have reviewed all documentation for this visit.  The documentation on 09/07/20 for the exam, diagnosis, procedures, and orders are all accurate and complete.

## 2020-09-30 ENCOUNTER — Ambulatory Visit: Payer: PPO | Admitting: Dermatology

## 2020-10-17 DIAGNOSIS — E7849 Other hyperlipidemia: Secondary | ICD-10-CM | POA: Diagnosis not present

## 2020-10-17 DIAGNOSIS — K219 Gastro-esophageal reflux disease without esophagitis: Secondary | ICD-10-CM | POA: Diagnosis not present

## 2020-10-17 DIAGNOSIS — M1990 Unspecified osteoarthritis, unspecified site: Secondary | ICD-10-CM | POA: Diagnosis not present

## 2020-12-04 ENCOUNTER — Other Ambulatory Visit: Payer: Self-pay | Admitting: Internal Medicine

## 2020-12-04 DIAGNOSIS — Z1231 Encounter for screening mammogram for malignant neoplasm of breast: Secondary | ICD-10-CM

## 2020-12-15 DIAGNOSIS — E7849 Other hyperlipidemia: Secondary | ICD-10-CM | POA: Diagnosis not present

## 2020-12-15 DIAGNOSIS — M1990 Unspecified osteoarthritis, unspecified site: Secondary | ICD-10-CM | POA: Diagnosis not present

## 2020-12-15 DIAGNOSIS — K219 Gastro-esophageal reflux disease without esophagitis: Secondary | ICD-10-CM | POA: Diagnosis not present

## 2020-12-24 DIAGNOSIS — R52 Pain, unspecified: Secondary | ICD-10-CM | POA: Diagnosis not present

## 2020-12-24 DIAGNOSIS — Z6828 Body mass index (BMI) 28.0-28.9, adult: Secondary | ICD-10-CM | POA: Diagnosis not present

## 2020-12-24 DIAGNOSIS — Z0000 Encounter for general adult medical examination without abnormal findings: Secondary | ICD-10-CM | POA: Diagnosis not present

## 2020-12-24 DIAGNOSIS — M1710 Unilateral primary osteoarthritis, unspecified knee: Secondary | ICD-10-CM | POA: Diagnosis not present

## 2020-12-24 DIAGNOSIS — Z299 Encounter for prophylactic measures, unspecified: Secondary | ICD-10-CM | POA: Diagnosis not present

## 2020-12-24 DIAGNOSIS — Z1339 Encounter for screening examination for other mental health and behavioral disorders: Secondary | ICD-10-CM | POA: Diagnosis not present

## 2020-12-24 DIAGNOSIS — R030 Elevated blood-pressure reading, without diagnosis of hypertension: Secondary | ICD-10-CM | POA: Diagnosis not present

## 2020-12-24 DIAGNOSIS — Z1331 Encounter for screening for depression: Secondary | ICD-10-CM | POA: Diagnosis not present

## 2020-12-24 DIAGNOSIS — E7800 Pure hypercholesterolemia, unspecified: Secondary | ICD-10-CM | POA: Diagnosis not present

## 2020-12-24 DIAGNOSIS — Z1211 Encounter for screening for malignant neoplasm of colon: Secondary | ICD-10-CM | POA: Diagnosis not present

## 2020-12-24 DIAGNOSIS — Z7189 Other specified counseling: Secondary | ICD-10-CM | POA: Diagnosis not present

## 2020-12-26 DIAGNOSIS — Z79899 Other long term (current) drug therapy: Secondary | ICD-10-CM | POA: Diagnosis not present

## 2020-12-26 DIAGNOSIS — R5383 Other fatigue: Secondary | ICD-10-CM | POA: Diagnosis not present

## 2020-12-26 DIAGNOSIS — E559 Vitamin D deficiency, unspecified: Secondary | ICD-10-CM | POA: Diagnosis not present

## 2020-12-26 DIAGNOSIS — E7800 Pure hypercholesterolemia, unspecified: Secondary | ICD-10-CM | POA: Diagnosis not present

## 2020-12-30 ENCOUNTER — Encounter: Payer: Self-pay | Admitting: *Deleted

## 2021-01-01 ENCOUNTER — Telehealth: Payer: Self-pay | Admitting: Internal Medicine

## 2021-01-01 NOTE — Telephone Encounter (Signed)
Noted  

## 2021-01-01 NOTE — Telephone Encounter (Signed)
Pt is already scheduled for a nurse visit with [place] on 01/17/21 at 9am

## 2021-01-01 NOTE — Telephone Encounter (Signed)
Called patient and she said that she was also having difficulty swallowing.  Stated we told her years ago that she had NASH.  Was supposed to come back for repeat ultrasound 2016 and did not.  I cancelled her nurse visit and told her to have the PCP send a new referral with all the information so we could get her in for an office visit.

## 2021-01-01 NOTE — Telephone Encounter (Signed)
PATIENT CALLED STATING SHE THOUGHT SHE WAS SUPPOSED TO HAVE A REPEAT ENDO SEVERAL YEARS (DR ROURK)BACK BUT FORGOT TO CALL AND SCHEDULE, DOES SHE NEED A REFERRAL?

## 2021-01-01 NOTE — Telephone Encounter (Signed)
If she is way overdue for a repeat Endo does she need an office visit

## 2021-01-01 NOTE — Telephone Encounter (Signed)
Pt has not had an EGD since 2011. Per Dr.Rourks result letter from the biopsy, he did not recommend a repeat EGD. If she is having problems or feels like she needs to have an EGD, she will need an office visit.

## 2021-01-14 DIAGNOSIS — E7849 Other hyperlipidemia: Secondary | ICD-10-CM | POA: Diagnosis not present

## 2021-01-14 DIAGNOSIS — I110 Hypertensive heart disease with heart failure: Secondary | ICD-10-CM | POA: Diagnosis not present

## 2021-01-14 DIAGNOSIS — K219 Gastro-esophageal reflux disease without esophagitis: Secondary | ICD-10-CM | POA: Diagnosis not present

## 2021-01-20 ENCOUNTER — Ambulatory Visit: Payer: PPO

## 2021-03-02 DIAGNOSIS — Z299 Encounter for prophylactic measures, unspecified: Secondary | ICD-10-CM | POA: Diagnosis not present

## 2021-03-02 DIAGNOSIS — K449 Diaphragmatic hernia without obstruction or gangrene: Secondary | ICD-10-CM | POA: Diagnosis not present

## 2021-03-02 DIAGNOSIS — R110 Nausea: Secondary | ICD-10-CM | POA: Diagnosis not present

## 2021-03-02 DIAGNOSIS — R1310 Dysphagia, unspecified: Secondary | ICD-10-CM | POA: Diagnosis not present

## 2021-04-06 ENCOUNTER — Other Ambulatory Visit: Payer: Self-pay

## 2021-04-06 ENCOUNTER — Ambulatory Visit
Admission: RE | Admit: 2021-04-06 | Discharge: 2021-04-06 | Disposition: A | Discharge status: Home / Self Care | Payer: PPO | Source: Ambulatory Visit | Attending: Internal Medicine | Admitting: Internal Medicine

## 2021-04-06 DIAGNOSIS — Z1231 Encounter for screening mammogram for malignant neoplasm of breast: Secondary | ICD-10-CM | POA: Diagnosis not present

## 2021-04-14 DIAGNOSIS — Z789 Other specified health status: Secondary | ICD-10-CM | POA: Diagnosis not present

## 2021-04-14 DIAGNOSIS — R030 Elevated blood-pressure reading, without diagnosis of hypertension: Secondary | ICD-10-CM | POA: Diagnosis not present

## 2021-04-14 DIAGNOSIS — M549 Dorsalgia, unspecified: Secondary | ICD-10-CM | POA: Diagnosis not present

## 2021-04-14 DIAGNOSIS — M7062 Trochanteric bursitis, left hip: Secondary | ICD-10-CM | POA: Diagnosis not present

## 2021-04-14 DIAGNOSIS — Z6828 Body mass index (BMI) 28.0-28.9, adult: Secondary | ICD-10-CM | POA: Diagnosis not present

## 2021-04-14 DIAGNOSIS — M545 Low back pain, unspecified: Secondary | ICD-10-CM | POA: Diagnosis not present

## 2021-04-14 DIAGNOSIS — M5387 Other specified dorsopathies, lumbosacral region: Secondary | ICD-10-CM | POA: Diagnosis not present

## 2021-04-14 DIAGNOSIS — M461 Sacroiliitis, not elsewhere classified: Secondary | ICD-10-CM | POA: Diagnosis not present

## 2021-04-14 DIAGNOSIS — M4185 Other forms of scoliosis, thoracolumbar region: Secondary | ICD-10-CM | POA: Diagnosis not present

## 2021-04-14 DIAGNOSIS — R52 Pain, unspecified: Secondary | ICD-10-CM | POA: Diagnosis not present

## 2021-04-14 DIAGNOSIS — M25552 Pain in left hip: Secondary | ICD-10-CM | POA: Diagnosis not present

## 2021-04-14 DIAGNOSIS — Z299 Encounter for prophylactic measures, unspecified: Secondary | ICD-10-CM | POA: Diagnosis not present

## 2021-04-14 DIAGNOSIS — M5136 Other intervertebral disc degeneration, lumbar region: Secondary | ICD-10-CM | POA: Diagnosis not present

## 2021-04-16 DIAGNOSIS — R42 Dizziness and giddiness: Secondary | ICD-10-CM | POA: Diagnosis not present

## 2021-04-16 DIAGNOSIS — I10 Essential (primary) hypertension: Secondary | ICD-10-CM | POA: Diagnosis not present

## 2021-04-23 DIAGNOSIS — Z299 Encounter for prophylactic measures, unspecified: Secondary | ICD-10-CM | POA: Diagnosis not present

## 2021-04-23 DIAGNOSIS — M545 Low back pain, unspecified: Secondary | ICD-10-CM | POA: Diagnosis not present

## 2021-04-23 DIAGNOSIS — M461 Sacroiliitis, not elsewhere classified: Secondary | ICD-10-CM | POA: Diagnosis not present

## 2021-05-05 ENCOUNTER — Other Ambulatory Visit: Payer: Self-pay

## 2021-05-05 ENCOUNTER — Encounter: Payer: Self-pay | Admitting: Dermatology

## 2021-05-05 ENCOUNTER — Ambulatory Visit: Payer: PPO | Admitting: Dermatology

## 2021-05-05 DIAGNOSIS — D2239 Melanocytic nevi of other parts of face: Secondary | ICD-10-CM

## 2021-05-05 DIAGNOSIS — D485 Neoplasm of uncertain behavior of skin: Secondary | ICD-10-CM

## 2021-05-05 DIAGNOSIS — L905 Scar conditions and fibrosis of skin: Secondary | ICD-10-CM | POA: Diagnosis not present

## 2021-05-05 DIAGNOSIS — L299 Pruritus, unspecified: Secondary | ICD-10-CM | POA: Diagnosis not present

## 2021-05-05 DIAGNOSIS — L851 Acquired keratosis [keratoderma] palmaris et plantaris: Secondary | ICD-10-CM

## 2021-05-05 DIAGNOSIS — Z1283 Encounter for screening for malignant neoplasm of skin: Secondary | ICD-10-CM

## 2021-05-05 DIAGNOSIS — D225 Melanocytic nevi of trunk: Secondary | ICD-10-CM

## 2021-05-05 MED ORDER — TRETINOIN 0.05 % EX GEL
1.0000 | Freq: Every day | CUTANEOUS | 3 refills | Status: DC
Start: 2021-05-05 — End: 2021-07-14

## 2021-05-05 NOTE — Patient Instructions (Signed)

## 2021-05-07 DIAGNOSIS — M545 Low back pain, unspecified: Secondary | ICD-10-CM | POA: Diagnosis not present

## 2021-05-12 DIAGNOSIS — Z299 Encounter for prophylactic measures, unspecified: Secondary | ICD-10-CM | POA: Diagnosis not present

## 2021-05-12 DIAGNOSIS — M549 Dorsalgia, unspecified: Secondary | ICD-10-CM | POA: Diagnosis not present

## 2021-05-14 NOTE — Progress Notes (Signed)
   Follow-Up Visit   Subjective  Dawn Hammond is a 55 y.o. female who presents for the following: Annual Exam (Here to have skin exam. Concerns mole on back that changed, itchy area on the left ear, raised area on right jaw area she wants removed, and darker area on left arm. [name] refill on tretinoin for a stronger percent. ).  2 spots that have changed below jawline and lower back.  Itching on ear.  General skin check. Location:  Duration:  Quality:  Associated Signs/Symptoms: Modifying Factors:  Severity:  Timing: Context:   Objective  Well appearing patient in no apparent distress; mood and affect are within normal limits. Mid Back Full body skin exam.  No atypical pigmented lesions.  To possible small basal cell skin cancers on lower back and below mandible will be biopsied.  Left Lower Back Pearly 5 mm papule     Right Submandibular Area Pearly 5 mm papule     Left Forearm - Posterior Subtly textured monochrome 5 mm flat scale  Left Ear Itch with no rash or growth.  Etiology uncertain, uncommon place for neurogenic pruritus.    A full examination was performed including scalp, head, eyes, ears, nose, lips, neck, chest, axillae, abdomen, back, buttocks, bilateral upper extremities, bilateral lower extremities, hands, feet, fingers, toes, fingernails, and toenails. All findings within normal limits unless otherwise noted below.  Areas beneath undergarments not fully examined.   Assessment & Plan    Screening for malignant neoplasm of skin Mid Back  Yearly skin exams.  Encouraged to self examine twice annually.  Continue ultraviolet protection.  Neoplasm of uncertain behavior of skin (2) Left Lower Back  Skin / nail biopsy Type of biopsy: tangential   Informed consent: discussed and consent obtained   Timeout: patient name, date of birth, surgical site, and procedure verified   Anesthesia: the lesion was anesthetized in a standard fashion    Anesthetic:  1% lidocaine w/ epinephrine 1-100,000 local infiltration Instrument used: flexible razor blade   Hemostasis achieved with: ferric subsulfate   Outcome: patient tolerated procedure well   Post-procedure details: wound care instructions given    Specimen 1 - Surgical pathology Differential Diagnosis: scc vs bcc  Check Margins: No  Right Submandibular Area  Skin / nail biopsy Type of biopsy: tangential   Informed consent: discussed and consent obtained   Timeout: patient name, date of birth, surgical site, and procedure verified   Anesthesia: the lesion was anesthetized in a standard fashion   Anesthetic:  1% lidocaine w/ epinephrine 1-100,000 local infiltration Instrument used: flexible razor blade   Hemostasis achieved with: ferric subsulfate   Outcome: patient tolerated procedure well   Post-procedure details: wound care instructions given    Specimen 2 - Surgical pathology Differential Diagnosis: scc vs bcc  Check Margins: No  Stucco keratosis Left Forearm - Posterior  Leave if stable  Pruritus Left Ear  Cereve itch relief or other over-the-counter lotion containing the ingredient pramoxine.  If this fails and the itching is severe enough, will consider topical doxepin.      I, Dawn Monarch, MD, have reviewed all documentation for this visit.  The documentation on 05/14/21 for the exam, diagnosis, procedures, and orders are all accurate and complete.

## 2021-05-17 DIAGNOSIS — I10 Essential (primary) hypertension: Secondary | ICD-10-CM | POA: Diagnosis not present

## 2021-05-17 DIAGNOSIS — R42 Dizziness and giddiness: Secondary | ICD-10-CM | POA: Diagnosis not present

## 2021-07-14 ENCOUNTER — Other Ambulatory Visit: Payer: Self-pay

## 2021-07-14 ENCOUNTER — Encounter: Payer: Self-pay | Admitting: Gastroenterology

## 2021-07-14 ENCOUNTER — Ambulatory Visit: Payer: PPO | Admitting: Gastroenterology

## 2021-07-14 VITALS — BP 124/64 | HR 88 | Temp 97.5°F | Ht 65.0 in | Wt 167.2 lb

## 2021-07-14 DIAGNOSIS — R1319 Other dysphagia: Secondary | ICD-10-CM | POA: Diagnosis not present

## 2021-07-14 DIAGNOSIS — R1310 Dysphagia, unspecified: Secondary | ICD-10-CM | POA: Insufficient documentation

## 2021-07-14 DIAGNOSIS — K219 Gastro-esophageal reflux disease without esophagitis: Secondary | ICD-10-CM | POA: Diagnosis not present

## 2021-07-14 DIAGNOSIS — R932 Abnormal findings on diagnostic imaging of liver and biliary tract: Secondary | ICD-10-CM

## 2021-07-14 DIAGNOSIS — Z1211 Encounter for screening for malignant neoplasm of colon: Secondary | ICD-10-CM

## 2021-07-14 NOTE — H&P (View-Only) (Signed)
Primary Care Physician:  Monico Blitz, MD  Primary Gastroenterologist:  Garfield Cornea, MD   Chief Complaint  Patient presents with   Gastroesophageal Reflux    Was having nausea but not now. Has a lot of indigestion    Consult    TCS/EGD. Last TCS 2012    HPI:  Dawn Hammond is a 55 y.o. female here to schedule colonoscopy and consider upper endoscopy.  She also wants to follow-up on liver disease.  She was last seen in 2012.  Back in 2012 she had a CT scan of the abdomen and pelvis that showed small lobular area of margins of the liver raising the possibility of developing cirrhosis.  Reviewed CT imaging with radiologist at that time he felt like overall her liver appeared unremarkable.  He did not agree with call for possible early cirrhosis stating only 1 area of questionable lobulation seen on the right hepatic margin which appeared to be secondary to ribs/musculature not liver edge.  We encourage patient to have a follow-up ultrasound in 2 years but she did not complete this evaluation.  Presents today for follow-up. Chronic GERD. On pepcid 20mg  daily. Protonix 40mg  daily. Burping food back up, chunks of food. Some nausea. Some solid food dysphagia. Not as bad as when had EGD before but did come back. No abdominal pain. Takes stool softener every day, has BM every day. Works fairly well. No melena, brbpr.  No unintentional weight loss.  Patient states her liver and gallbladder are adhesed to the abdominal wall were centrally near her umbilicus.  States at time of emergent appendectomy this was discovered and she had a laceration of her liver at that time.  EGD November 2011: Noncritical Schatzki ring, patient more proximal 30 K-Sol rings.  Esophagus dilated.  Biopsies unremarkable, negative for EE.  Colonoscopy October 2012 by Dr. Gala Romney was normal.  Current Outpatient Medications  Medication Sig Dispense Refill   b complex vitamins tablet Take 1 tablet by mouth daily. b12      calcium-vitamin D (OSCAL WITH D) 500-200 MG-UNIT per tablet Take 1 tablet by mouth daily.       estradiol (ESTRACE) 0.5 MG tablet Take 0.5 mg by mouth daily. As needed     famotidine (PEPCID) 20 MG tablet Take 20 mg by mouth daily.      ibuprofen (ADVIL,MOTRIN) 200 MG tablet Take 200 mg by mouth every 6 (six) hours as needed.       pantoprazole (PROTONIX) 40 MG tablet Take 40 mg by mouth daily.     tiZANidine (ZANAFLEX) 4 MG tablet every 6 (six) hours as needed.     vitamin E 400 UNIT capsule Take 400 Units by mouth daily.       No current facility-administered medications for this visit.    Allergies as of 07/14/2021 - Review Complete 07/14/2021  Allergen Reaction Noted   Amoxicillin Rash 07/22/2010   Ascorbic acid Rash 07/27/2011   Clindamycin/lincomycin Rash 03/01/2012   Dilaudid [hydromorphone] Rash 07/14/2021   Hydrocodone Itching, Nausea And Vomiting, and Rash    Oxycodone hcl Itching, Nausea And Vomiting, and Rash 07/22/2010    Past Medical History:  Diagnosis Date   Chronic sinusitis    COPD (chronic obstructive pulmonary disease) (HCC)    emphysema   Fibromyalgia    GERD (gastroesophageal reflux disease)    Impaired hearing    Pneumonia, viral 1984   x2 tracheostomy/ Collapsed lung   PONV (postoperative nausea and vomiting)    after septoplasty  surgery   Sleep apnea    does not wear a CPAP-very mild   Urticaria     Past Surgical History:  Procedure Laterality Date   APPENDECTOMY  2015   COLONOSCOPY  08/11/2011   Rourk: Normal   ESOPHAGOGASTRODUODENOSCOPY  08/18/2010   noncritical schatzki ring s/p dilation, small hh, biopsy negative for EE   NASAL SEPTOPLASTY W/ TURBINOPLASTY  03/06/2012   Procedure: NASAL SEPTOPLASTY WITH TURBINATE REDUCTION;  Surgeon: Jodi Marble, MD;  Location: [facility];  Service: ENT;  Laterality: Bilateral;   TOTAL ABDOMINAL HYSTERECTOMY     tracheostomy  10/18/1982    Family History  Problem Relation Age of  Onset   Breast cancer Maternal Aunt    Uterine cancer Maternal Aunt    Kidney cancer Mother    Colon cancer Neg Hx     Social History   Socioeconomic History   Marital status: Married    Spouse name: Ronalee Belts   Number of children: 0   Years of education: Not on file   Highest education level: GED or equivalent  Occupational History   Occupation: retired due to hearing impairment  Tobacco Use   Smoking status: Never   Smokeless tobacco: Never  Vaping Use   Vaping Use: Never used  Substance and Sexual Activity   Alcohol use: No   Drug use: No   Sexual activity: Not on file  Other Topics Concern   Not on file  Social History Narrative   Patient is right-handed. She lives with her husband ina one level home. She drinks 3 cups of coffee a day. She does not exercise.   Social Determinants of Health   Financial Resource Strain: Not on file  Food Insecurity: Not on file  Transportation Needs: Not on file  Physical Activity: Not on file  Stress: Not on file  Social Connections: Not on file  Intimate Partner Violence: Not on file      ROS:  General: Negative for anorexia, weight loss, fever, chills, fatigue, weakness. Eyes: Negative for vision changes.  ENT: Negative for hoarseness, nasal congestion.  See HPI. CV: Negative for chest pain, angina, palpitations, dyspnea on exertion, peripheral edema.  Respiratory: Negative for dyspnea at rest, dyspnea on exertion, cough, sputum, wheezing.  GI: See history of present illness. GU:  Negative for dysuria, hematuria, urinary incontinence, urinary frequency, nocturnal urination.  MS: Negative for joint pain, low back pain.  Positive muscle pain Derm: Negative for rash or itching.  Neuro: Negative for weakness, abnormal sensation, seizure, frequent headaches, memory loss, confusion.  Psych: Negative for anxiety, depression, suicidal ideation, hallucinations.  Endo: Negative for unusual weight change.  Heme: Negative for bruising or  bleeding. Allergy: Negative for rash or hives.    Physical Examination:  BP 124/64   Pulse 88   Temp (!) 97.5 F (36.4 C)   Ht 5\' 5"  (1.651 m)   Wt 167 lb 3.2 oz (75.8 kg)   BMI 27.82 kg/m    General: Well-nourished, well-developed in no acute distress.  Head: Normocephalic, atraumatic.   Eyes: Conjunctiva pink, no icterus. Mouth:masked Neck: Supple without thyromegaly, masses, or lymphadenopathy.  Lungs: Clear to auscultation bilaterally.  Heart: Regular rate and rhythm, no murmurs rubs or gallops.  Abdomen: Bowel sounds are normal, nontender, nondistended, no hepatosplenomegaly or masses, no abdominal bruits or    hernia , no rebound or guarding.   Rectal: Not performed Extremities: No lower extremity edema. No clubbing or deformities.  Neuro: Alert and oriented x  4 , grossly normal neurologically.  Skin: Warm and dry, no rash or jaundice.   Psych: Alert and cooperative, normal mood and affect.  Labs: March 2022: White blood cell count 6900, hemoglobin 14.5, hematocrit 42.6, MCV 86, platelets 319,000, TSH 2.480, glucose 104, BUN 11, creatinine 0.75, albumin 4.7, total bilirubin 0.3, alkaline phosphatase 75, AST 20, ALT 24  Imaging Studies: No results found.   Assessment:  GERD/Dysphagia: Longstanding reflux.  Typical heartburn well controlled.  Describes frequent regurgitation.  Solid food dysphagia.  Required esophageal dilation in the remote past.  Recommend upper endoscopy in the near future, consider esophageal dilation.  May have esophageal stricture.   Colon cancer screening: Last colonoscopy 10 years ago.  No family history of colon cancer or colon polyps.  Denies any new GI symptoms. Chronic constipation well managed with colace. States she does not take her calcium as often as she should as it makes her constipation worse. Trial of Linzess 8mcg daily. She did not tolerate miralax or benefiber due to texture.   Abnormal liver on prior CT: possible overcall on  lobulated liver margin when over read completed. We had recommended 2 year follow up u/s but patient did not complete. Plan for one at this time. Patient states liver more midline above umbilicus per surgeon at time of appendectomy in 2015. No imaging available at that time.   Plan: Colonoscopy/upper endoscopy with possible esophageal dilation with Dr. Gala Romney with propofol. ASA II.  I have discussed the risks, alternatives, benefits with regards to but not limited to the risk of reaction to medication, bleeding, infection, perforation and the patient is agreeable to proceed. Written consent to be obtained. Abdominal u/s.

## 2021-07-14 NOTE — Progress Notes (Signed)
Primary Care Physician:  Monico Blitz, MD  Primary Gastroenterologist:  Garfield Cornea, MD   Chief Complaint  Patient presents with   Gastroesophageal Reflux    Was having nausea but not now. Has a lot of indigestion    Consult    TCS/EGD. Last TCS 2012    HPI:  Dawn Hammond is a 55 y.o. female here to schedule colonoscopy and consider upper endoscopy.  She also wants to follow-up on liver disease.  She was last seen in 2012.  Back in 2012 she had a CT scan of the abdomen and pelvis that showed small lobular area of margins of the liver raising the possibility of developing cirrhosis.  Reviewed CT imaging with radiologist at that time he felt like overall her liver appeared unremarkable.  He did not agree with call for possible early cirrhosis stating only 1 area of questionable lobulation seen on the right hepatic margin which appeared to be secondary to ribs/musculature not liver edge.  We encourage patient to have a follow-up ultrasound in 2 years but she did not complete this evaluation.  Presents today for follow-up. Chronic GERD. On pepcid 20mg  daily. Protonix 40mg  daily. Burping food back up, chunks of food. Some nausea. Some solid food dysphagia. Not as bad as when had EGD before but did come back. No abdominal pain. Takes stool softener every day, has BM every day. Works fairly well. No melena, brbpr.  No unintentional weight loss.  Patient states her liver and gallbladder are adhesed to the abdominal wall were centrally near her umbilicus.  States at time of emergent appendectomy this was discovered and she had a laceration of her liver at that time.  EGD November 2011: Noncritical Schatzki ring, patient more proximal 30 K-Sol rings.  Esophagus dilated.  Biopsies unremarkable, negative for EE.  Colonoscopy October 2012 by Dr. Gala Romney was normal.  Current Outpatient Medications  Medication Sig Dispense Refill   b complex vitamins tablet Take 1 tablet by mouth daily. b12      calcium-vitamin D (OSCAL WITH D) 500-200 MG-UNIT per tablet Take 1 tablet by mouth daily.       estradiol (ESTRACE) 0.5 MG tablet Take 0.5 mg by mouth daily. As needed     famotidine (PEPCID) 20 MG tablet Take 20 mg by mouth daily.      ibuprofen (ADVIL,MOTRIN) 200 MG tablet Take 200 mg by mouth every 6 (six) hours as needed.       pantoprazole (PROTONIX) 40 MG tablet Take 40 mg by mouth daily.     tiZANidine (ZANAFLEX) 4 MG tablet every 6 (six) hours as needed.     vitamin E 400 UNIT capsule Take 400 Units by mouth daily.       No current facility-administered medications for this visit.    Allergies as of 07/14/2021 - Review Complete 07/14/2021  Allergen Reaction Noted   Amoxicillin Rash 07/22/2010   Ascorbic acid Rash 07/27/2011   Clindamycin/lincomycin Rash 03/01/2012   Dilaudid [hydromorphone] Rash 07/14/2021   Hydrocodone Itching, Nausea And Vomiting, and Rash    Oxycodone hcl Itching, Nausea And Vomiting, and Rash 07/22/2010    Past Medical History:  Diagnosis Date   Chronic sinusitis    COPD (chronic obstructive pulmonary disease) (HCC)    emphysema   Fibromyalgia    GERD (gastroesophageal reflux disease)    Impaired hearing    Pneumonia, viral 1984   x2 tracheostomy/ Collapsed lung   PONV (postoperative nausea and vomiting)    after septoplasty  surgery   Sleep apnea    does not wear a CPAP-very mild   Urticaria     Past Surgical History:  Procedure Laterality Date   APPENDECTOMY  2015   COLONOSCOPY  08/11/2011   Rourk: Normal   ESOPHAGOGASTRODUODENOSCOPY  08/18/2010   noncritical schatzki ring s/p dilation, small hh, biopsy negative for EE   NASAL SEPTOPLASTY W/ TURBINOPLASTY  03/06/2012   Procedure: NASAL SEPTOPLASTY WITH TURBINATE REDUCTION;  Surgeon: Jodi Marble, MD;  Location: [facility];  Service: ENT;  Laterality: Bilateral;   TOTAL ABDOMINAL HYSTERECTOMY     tracheostomy  10/18/1982    Family History  Problem Relation Age of  Onset   Breast cancer Maternal Aunt    Uterine cancer Maternal Aunt    Kidney cancer Mother    Colon cancer Neg Hx     Social History   Socioeconomic History   Marital status: Married    Spouse name: Ronalee Belts   Number of children: 0   Years of education: Not on file   Highest education level: GED or equivalent  Occupational History   Occupation: retired due to hearing impairment  Tobacco Use   Smoking status: Never   Smokeless tobacco: Never  Vaping Use   Vaping Use: Never used  Substance and Sexual Activity   Alcohol use: No   Drug use: No   Sexual activity: Not on file  Other Topics Concern   Not on file  Social History Narrative   Patient is right-handed. She lives with her husband ina one level home. She drinks 3 cups of coffee a day. She does not exercise.   Social Determinants of Health   Financial Resource Strain: Not on file  Food Insecurity: Not on file  Transportation Needs: Not on file  Physical Activity: Not on file  Stress: Not on file  Social Connections: Not on file  Intimate Partner Violence: Not on file      ROS:  General: Negative for anorexia, weight loss, fever, chills, fatigue, weakness. Eyes: Negative for vision changes.  ENT: Negative for hoarseness, nasal congestion.  See HPI. CV: Negative for chest pain, angina, palpitations, dyspnea on exertion, peripheral edema.  Respiratory: Negative for dyspnea at rest, dyspnea on exertion, cough, sputum, wheezing.  GI: See history of present illness. GU:  Negative for dysuria, hematuria, urinary incontinence, urinary frequency, nocturnal urination.  MS: Negative for joint pain, low back pain.  Positive muscle pain Derm: Negative for rash or itching.  Neuro: Negative for weakness, abnormal sensation, seizure, frequent headaches, memory loss, confusion.  Psych: Negative for anxiety, depression, suicidal ideation, hallucinations.  Endo: Negative for unusual weight change.  Heme: Negative for bruising or  bleeding. Allergy: Negative for rash or hives.    Physical Examination:  BP 124/64   Pulse 88   Temp (!) 97.5 F (36.4 C)   Ht 5\' 5"  (1.651 m)   Wt 167 lb 3.2 oz (75.8 kg)   BMI 27.82 kg/m    General: Well-nourished, well-developed in no acute distress.  Head: Normocephalic, atraumatic.   Eyes: Conjunctiva pink, no icterus. Mouth:masked Neck: Supple without thyromegaly, masses, or lymphadenopathy.  Lungs: Clear to auscultation bilaterally.  Heart: Regular rate and rhythm, no murmurs rubs or gallops.  Abdomen: Bowel sounds are normal, nontender, nondistended, no hepatosplenomegaly or masses, no abdominal bruits or    hernia , no rebound or guarding.   Rectal: Not performed Extremities: No lower extremity edema. No clubbing or deformities.  Neuro: Alert and oriented x  4 , grossly normal neurologically.  Skin: Warm and dry, no rash or jaundice.   Psych: Alert and cooperative, normal mood and affect.  Labs: March 2022: White blood cell count 6900, hemoglobin 14.5, hematocrit 42.6, MCV 86, platelets 319,000, TSH 2.480, glucose 104, BUN 11, creatinine 0.75, albumin 4.7, total bilirubin 0.3, alkaline phosphatase 75, AST 20, ALT 24  Imaging Studies: No results found.   Assessment:  GERD/Dysphagia: Longstanding reflux.  Typical heartburn well controlled.  Describes frequent regurgitation.  Solid food dysphagia.  Required esophageal dilation in the remote past.  Recommend upper endoscopy in the near future, consider esophageal dilation.  May have esophageal stricture.   Colon cancer screening: Last colonoscopy 10 years ago.  No family history of colon cancer or colon polyps.  Denies any new GI symptoms. Chronic constipation well managed with colace. States she does not take her calcium as often as she should as it makes her constipation worse. Trial of Linzess 58mcg daily. She did not tolerate miralax or benefiber due to texture.   Abnormal liver on prior CT: possible overcall on  lobulated liver margin when over read completed. We had recommended 2 year follow up u/s but patient did not complete. Plan for one at this time. Patient states liver more midline above umbilicus per surgeon at time of appendectomy in 2015. No imaging available at that time.   Plan: Colonoscopy/upper endoscopy with possible esophageal dilation with Dr. Gala Romney with propofol. ASA II.  I have discussed the risks, alternatives, benefits with regards to but not limited to the risk of reaction to medication, bleeding, infection, perforation and the patient is agreeable to proceed. Written consent to be obtained. Abdominal u/s.

## 2021-07-14 NOTE — Patient Instructions (Addendum)
Abdominal ultrasound as scheduled. Colonoscopy and upper endoscopy as scheduled.  Please see separate instructions. Linzess 72 mcg samples provided today.  Try 1 daily on an empty stomach for constipation.  You can use as needed.

## 2021-07-20 ENCOUNTER — Encounter (HOSPITAL_COMMUNITY)
Admission: RE | Disposition: A | Discharge status: Home / Self Care | Payer: Self-pay | Source: Home / Self Care | Attending: Internal Medicine

## 2021-07-20 ENCOUNTER — Ambulatory Visit (HOSPITAL_COMMUNITY)
Admission: RE | Admit: 2021-07-20 | Discharge: 2021-07-20 | Disposition: A | Discharge status: Home / Self Care | Payer: PPO | Attending: Internal Medicine | Admitting: Internal Medicine

## 2021-07-20 ENCOUNTER — Ambulatory Visit (HOSPITAL_COMMUNITY): Payer: PPO | Admitting: Anesthesiology

## 2021-07-20 ENCOUNTER — Other Ambulatory Visit: Payer: Self-pay

## 2021-07-20 ENCOUNTER — Encounter (HOSPITAL_COMMUNITY): Payer: Self-pay | Admitting: Internal Medicine

## 2021-07-20 DIAGNOSIS — K449 Diaphragmatic hernia without obstruction or gangrene: Secondary | ICD-10-CM | POA: Diagnosis not present

## 2021-07-20 DIAGNOSIS — J449 Chronic obstructive pulmonary disease, unspecified: Secondary | ICD-10-CM | POA: Diagnosis not present

## 2021-07-20 DIAGNOSIS — K222 Esophageal obstruction: Secondary | ICD-10-CM

## 2021-07-20 DIAGNOSIS — R1310 Dysphagia, unspecified: Secondary | ICD-10-CM | POA: Diagnosis not present

## 2021-07-20 DIAGNOSIS — K317 Polyp of stomach and duodenum: Secondary | ICD-10-CM | POA: Diagnosis not present

## 2021-07-20 DIAGNOSIS — K5909 Other constipation: Secondary | ICD-10-CM | POA: Insufficient documentation

## 2021-07-20 DIAGNOSIS — Z885 Allergy status to narcotic agent status: Secondary | ICD-10-CM | POA: Insufficient documentation

## 2021-07-20 DIAGNOSIS — Z1211 Encounter for screening for malignant neoplasm of colon: Secondary | ICD-10-CM

## 2021-07-20 DIAGNOSIS — Z9049 Acquired absence of other specified parts of digestive tract: Secondary | ICD-10-CM | POA: Insufficient documentation

## 2021-07-20 DIAGNOSIS — Z880 Allergy status to penicillin: Secondary | ICD-10-CM | POA: Diagnosis not present

## 2021-07-20 DIAGNOSIS — Z79899 Other long term (current) drug therapy: Secondary | ICD-10-CM | POA: Insufficient documentation

## 2021-07-20 DIAGNOSIS — Z881 Allergy status to other antibiotic agents status: Secondary | ICD-10-CM | POA: Diagnosis not present

## 2021-07-20 DIAGNOSIS — R110 Nausea: Secondary | ICD-10-CM | POA: Diagnosis not present

## 2021-07-20 DIAGNOSIS — K219 Gastro-esophageal reflux disease without esophagitis: Secondary | ICD-10-CM | POA: Insufficient documentation

## 2021-07-20 HISTORY — PX: ESOPHAGOGASTRODUODENOSCOPY (EGD) WITH PROPOFOL: SHX5813

## 2021-07-20 HISTORY — PX: MALONEY DILATION: SHX5535

## 2021-07-20 HISTORY — PX: COLONOSCOPY WITH PROPOFOL: SHX5780

## 2021-07-20 HISTORY — PX: BIOPSY: SHX5522

## 2021-07-20 SURGERY — COLONOSCOPY WITH PROPOFOL
Anesthesia: General

## 2021-07-20 MED ORDER — LIDOCAINE HCL (CARDIAC) PF 100 MG/5ML IV SOSY
PREFILLED_SYRINGE | INTRAVENOUS | Status: DC | PRN
  Administered 2021-07-20: 100 mg via INTRAVENOUS

## 2021-07-20 MED ORDER — LACTATED RINGERS IV SOLN: INTRAVENOUS | Status: DC

## 2021-07-20 MED ORDER — LACTATED RINGERS IV SOLN: INTRAVENOUS | Status: DC | PRN

## 2021-07-20 MED ORDER — STERILE WATER FOR IRRIGATION IR SOLN
Status: DC | PRN
  Administered 2021-07-20: 200 mL

## 2021-07-20 MED ORDER — PROPOFOL 500 MG/50ML IV EMUL
INTRAVENOUS | Status: DC | PRN
  Administered 2021-07-20: 150 ug/kg/min via INTRAVENOUS

## 2021-07-20 MED ORDER — PROPOFOL 10 MG/ML IV BOLUS
INTRAVENOUS | Status: DC | PRN
  Administered 2021-07-20: 100 mg via INTRAVENOUS

## 2021-07-20 NOTE — Anesthesia Postprocedure Evaluation (Signed)
Anesthesia Post Note  Patient: Dawn Hammond  Procedure(s) Performed: COLONOSCOPY WITH PROPOFOL ESOPHAGOGASTRODUODENOSCOPY (EGD) WITH PROPOFOL [place]  Patient location during evaluation: Phase II Anesthesia Type: General Level of consciousness: awake Pain management: pain level controlled Vital Signs Assessment: post-procedure vital signs reviewed and stable Respiratory status: spontaneous breathing and respiratory function stable Cardiovascular status: blood pressure returned to baseline and stable Postop Assessment: no headache and no apparent nausea or vomiting Anesthetic complications: no Comments: Late entry   No notable events documented.   Last Vitals:  Vitals:   07/20/21 1323 07/20/21 1502  BP: 113/81 125/81  Pulse: 75   Resp: 16 (!) 21  Temp: 36.6 C 36.4 C  SpO2: 97% 97%    Last Pain:  Vitals:   07/20/21 1502  TempSrc: Oral  PainSc: [place]

## 2021-07-20 NOTE — Discharge Instructions (Addendum)
EGD Discharge instructions Please read the instructions outlined below and refer to this sheet in the next few weeks. These discharge instructions provide you with general information on caring for yourself after you leave the hospital. Your doctor may also give you specific instructions. While your treatment has been planned according to the most current medical practices available, unavoidable complications occasionally occur. If you have any problems or questions after discharge, please call your doctor. ACTIVITY You may resume your regular activity but move at a slower pace for the next 24 hours.  Take frequent rest periods for the next 24 hours.  Walking will help expel (get rid of) the air and reduce the bloated feeling in your abdomen.  No driving for 24 hours (because of the anesthesia (medicine) used during the test).  You may shower.  Do not sign any important legal documents or operate any machinery for 24 hours (because of the anesthesia used during the test).  NUTRITION Drink plenty of fluids.  You may resume your normal diet.  Begin with a light meal and progress to your normal diet.  Avoid alcoholic beverages for 24 hours or as instructed by your caregiver.  MEDICATIONS You may resume your normal medications unless your caregiver tells you otherwise.  WHAT YOU CAN EXPECT TODAY You may experience abdominal discomfort such as a feeling of fullness or "gas" pains.  FOLLOW-UP Your doctor will discuss the results of your test with you.  SEEK IMMEDIATE MEDICAL ATTENTION IF ANY OF THE FOLLOWING OCCUR: Excessive nausea (feeling sick to your stomach) and/or vomiting.  Severe abdominal pain and distention (swelling).  Trouble swallowing.  Temperature over 101 F (37.8 C).  Rectal bleeding or vomiting of blood.   Your esophagus was stretched today  No polyps in your colon today but a polyp was removed from your stomach today  Recommend repeat screening colonoscopy in 10  years  Increase Protonix to 40 mg twice daily (before breakfast and supper) new prescription provided.  Further recommendations to follow pending review of polyp lab report.  Office visit with [place] in 3 months  At patient request, I called Dawn Hammond at [phone] -reviewed findings and recommendations.

## 2021-07-20 NOTE — Op Note (Signed)
[facility] Patient Name: Dawn Hammond Procedure Date: 07/20/2021 2:11 PM MRN: [id number] Date of Birth: 03-29-1966 Attending MD: Norvel Richards , MD CSN: [id number] Age: 55 Admit Type: Outpatient Procedure:                Upper GI endoscopy Indications:              Dysphagia Providers:                Norvel Richards, MD, Crystal Page, [place]                            Risa Grill, Technician Referring MD:              Medicines:                Propofol per Anesthesia Complications:            No immediate complications. Estimated Blood Loss:     Estimated blood loss was minimal. Procedure:                Pre-Anesthesia Assessment:                           - Prior to the procedure, a History and Physical                            was performed, and patient medications and                            allergies were reviewed. The patient's tolerance of                            previous anesthesia was also reviewed. The risks                            and benefits of the procedure and the sedation                            options and risks were discussed with the patient.                            All questions were answered, and informed consent                            was obtained. Prior Anticoagulants: The patient has                            taken no previous anticoagulant or antiplatelet                            agents. ASA Grade Assessment: II - A patient with                            mild systemic disease. After reviewing the risks  and benefits, the patient was deemed in                            satisfactory condition to undergo the procedure.                           After obtaining informed consent, the endoscope was                            passed under direct vision. Throughout the                            procedure, the patient's blood pressure, pulse, and                            oxygen  saturations were monitored continuously. The                            [id number]-[id number] ([id number]) scope was introduced through the                            mouth, and advanced to the second part of duodenum.                            The upper GI endoscopy was accomplished without                            difficulty. The patient tolerated the procedure                            well. Scope In: 2:35:10 PM Scope Out: 2:41:17 PM Total Procedure Duration: 0 hours 6 minutes 7 seconds  Findings:      A mild Schatzki ring was found at the gastroesophageal junction.       Otherwise, tubular esophagus appeared normal.      A small hiatal hernia was present. Patient had scattered 3 to 5 mm       pedunculated hyperplastic appearing gastric polyps: Stomach otherwise       normal.      The duodenal bulb and second portion of the duodenum were normal. Impression:               - Mild Schatzki ring.                           - Small hiatal hernia. Gastric polyp -biopsied                           - Normal duodenal bulb and second portion of the                            duodenum.                           - Moderate Sedation:      Moderate (conscious) sedation was personally administered by an  anesthesia professional. The following parameters were monitored: oxygen       saturation, heart rate, blood pressure, respiratory rate, EKG, adequacy       of pulmonary ventilation, and response to care. Recommendation:           - Patient has a contact number available for                            emergencies. The signs and symptoms of potential                            delayed complications were discussed with the                            patient. Return to normal activities tomorrow.                            Written discharge instructions were provided to the                            patient.                           - Resume previous diet.                           - Continue present  medications. Increase Protonix                            to 40 mg twice daily. Follow-up on pathology.                           - Return to my office in 3 months. See colonoscopy                            report Procedure Code(s):        --- Professional ---                           [id number], Esophagogastroduodenoscopy, flexible,                            transoral; diagnostic, including collection of                            specimen(s) by brushing or washing, when performed                            (separate procedure) Diagnosis Code(s):        --- Professional ---                           K22.2, Esophageal obstruction                           K44.9, Diaphragmatic hernia without obstruction or  gangrene                           R13.10, Dysphagia, unspecified CPT copyright 2019 American Medical Association. All rights reserved. The codes documented in this report are preliminary and upon coder review may  be revised to meet current compliance requirements. Cristopher Estimable. Ethelle Ola, MD Norvel Richards, MD 07/20/2021 3:08:54 PM This report has been signed electronically. Number of Addenda: 0

## 2021-07-20 NOTE — Interval H&P Note (Signed)
History and Physical Interval Note:  07/20/2021 2:21 PM  Dawn Hammond  has presented today for surgery, with the diagnosis of GERD, dysphagia, nausea, screening colonoscopy.  The various methods of treatment have been discussed with the patient and family. After consideration of risks, benefits and other options for treatment, the patient has consented to  Procedure(s) with comments: COLONOSCOPY WITH PROPOFOL (N/A) - 2:15pm ESOPHAGOGASTRODUODENOSCOPY (EGD) WITH PROPOFOL (N/A) MALONEY DILATION (N/A) as a surgical intervention.  The patient's history has been reviewed, patient examined, no change in status, stable for surgery.  I have reviewed the patient's chart and labs.  Questions were answered to the patient's satisfaction.     Dawn Hammond    No change.  Patient is here for an average risk screening colonoscopy and evaluation of dysphagia via EGD with possible esophageal dilation as feasible/appropriate per plan. The risks, benefits, limitations, imponderables and alternatives regarding both EGD and colonoscopy have been reviewed with the patient. Questions have been answered. All parties agreeable.

## 2021-07-20 NOTE — Op Note (Signed)
[facility] Patient Name: Dawn Hammond Procedure Date: 07/20/2021 2:44 PM MRN: [id number] Date of Birth: 02-Apr-1966 Attending MD: Norvel Richards , MD CSN: [id number] Age: 55 Admit Type: Outpatient Procedure:                Colonoscopy Indications:              Screening for colorectal malignant neoplasm Providers:                Norvel Richards, MD, [place] Page, [place]                            Risa Grill, Technician Referring MD:              Medicines:                Propofol per Anesthesia Complications:            No immediate complications. Estimated Blood Loss:     Estimated blood loss: none. Procedure:                Pre-Anesthesia Assessment:                           - Prior to the procedure, a History and Physical                            was performed, and patient medications and                            allergies were reviewed. The patient's tolerance of                            previous anesthesia was also reviewed. The risks                            and benefits of the procedure and the sedation                            options and risks were discussed with the patient.                            All questions were answered, and informed consent                            was obtained. Prior Anticoagulants: The patient has                            taken no previous anticoagulant or antiplatelet                            agents. ASA Grade Assessment: II - A patient with                            mild systemic disease. After reviewing the risks  and benefits, the patient was deemed in                            satisfactory condition to undergo the procedure.                           After obtaining informed consent, the colonoscope                            was passed under direct vision. Throughout the                            procedure, the patient's blood pressure, pulse, and                             oxygen saturations were monitored continuously. The                            [id number]) scope was introduced through the                            anus and advanced to the the cecum, identified by                            appendiceal orifice and ileocecal valve. The                            colonoscopy was performed without difficulty. The                            patient tolerated the procedure well. The quality                            of the bowel preparation was adequate. Scope In: 2:48:25 PM Scope Out: 2:58:38 PM Scope Withdrawal Time: 0 hours 6 minutes 21 seconds  Total Procedure Duration: 0 hours 10 minutes 13 seconds  Findings:      The perianal and digital rectal examinations were normal.      The colon (entire examined portion) appeared normal.      The retroflexed view of the distal rectum and anal verge was normal and       showed no anal or rectal abnormalities aside from minimal hemorrhoids       and anal papilla. Impression:               - The entire examined colon is normal.                           - The distal rectum and anal verge are normal on                            retroflexion view.                           - No specimens collected. Moderate Sedation:      Moderate (conscious) sedation  was personally administered by an       anesthesia professional. The following parameters were monitored: oxygen       saturation, heart rate, blood pressure, and response to care. Recommendation:           - Patient has a contact number available for                            emergencies. The signs and symptoms of potential                            delayed complications were discussed with the                            patient. Return to normal activities tomorrow.                            Written discharge instructions were provided to the                            patient.                           - Resume previous diet.                            - Repeat colonoscopy in 10 years for screening                            purposes.                           - Return to GI office (date not yet determined). Procedure Code(s):        --- Professional ---                           [phone], Colonoscopy, flexible; diagnostic, including                            collection of specimen(s) by brushing or washing,                            when performed (separate procedure) Diagnosis Code(s):        --- Professional ---                           Z12.11, Encounter for screening for malignant                            neoplasm of colon CPT copyright 2019 American Medical Association. All rights reserved. The codes documented in this report are preliminary and upon coder review may  be revised to meet current compliance requirements. Dawn Hammond. Dawn Schraeder, MD Norvel Richards, MD 07/20/2021 3:12:21 PM This report has been signed electronically. Number of Addenda: 0

## 2021-07-20 NOTE — Anesthesia Preprocedure Evaluation (Signed)
Anesthesia Evaluation  Patient identified by MRN, date of birth, ID band Patient awake    Reviewed: Allergy & Precautions, H&P , NPO status , Patient's Chart, lab work & pertinent test results, reviewed documented beta blocker date and time   History of Anesthesia Complications (+) PONV and history of anesthetic complications  Airway Mallampati: II  TM Distance: >3 FB Neck ROM: full    Dental no notable dental hx.    Pulmonary sleep apnea , COPD,    Pulmonary exam normal breath sounds clear to auscultation       Cardiovascular Exercise Tolerance: Good negative cardio ROS   Rhythm:regular Rate:Normal     Neuro/Psych  Neuromuscular disease negative psych ROS   GI/Hepatic Neg liver ROS, hiatal hernia, GERD  Medicated,  Endo/Other  negative endocrine ROS  Renal/GU negative Renal ROS  negative genitourinary   Musculoskeletal   Abdominal   Peds  Hematology negative hematology ROS (+)   Anesthesia Other Findings   Reproductive/Obstetrics negative OB ROS                             Anesthesia Physical Anesthesia Plan  ASA: 3  Anesthesia Plan: General   Post-op Pain Management:    Induction:   PONV Risk Score and Plan: Propofol infusion  Airway Management Planned:   Additional Equipment:   Intra-op Plan:   Post-operative Plan:   Informed Consent: I have reviewed the patients History and Physical, chart, labs and discussed the procedure including the risks, benefits and alternatives for the proposed anesthesia with the patient or authorized representative who has indicated his/her understanding and acceptance.     Dental Advisory Given  Plan Discussed with: CRNA  Anesthesia Plan Comments:         Anesthesia Quick Evaluation

## 2021-07-20 NOTE — Transfer of Care (Signed)
Immediate Anesthesia Transfer of Care Note  Patient: Dawn Hammond  Procedure(s) Performed: COLONOSCOPY WITH PROPOFOL ESOPHAGOGASTRODUODENOSCOPY (EGD) WITH PROPOFOL [place]  Patient Location: Endoscopy Unit  Anesthesia Type:General  Level of Consciousness: awake  Airway & Oxygen Therapy: Patient Spontanous Breathing  Post-op Assessment: Report given to RN and Post -op Vital signs reviewed and stable  Post vital signs: Reviewed and stable  Last Vitals:  Vitals Value Taken Time  BP    Temp    Pulse    Resp    SpO2      Last Pain:  Vitals:   07/20/21 1431  TempSrc:   PainSc: 2          Complications: No notable events documented.

## 2021-07-20 NOTE — Anesthesia Procedure Notes (Signed)
Date/Time: 07/20/2021 2:44 PM Performed by: Orlie Dakin, CRNA Pre-anesthesia Checklist: Patient identified, Emergency Drugs available, Suction available and Patient being monitored Patient Re-evaluated:Patient Re-evaluated prior to induction Oxygen Delivery Method: Nasal cannula Induction Type: IV induction Placement Confirmation: positive ETCO2

## 2021-07-21 NOTE — Addendum Note (Signed)
Addendum  created 07/21/21 1326 by Orlie Dakin, CRNA   Charge Capture section accepted

## 2021-07-22 ENCOUNTER — Encounter: Payer: Self-pay | Admitting: Internal Medicine

## 2021-07-22 LAB — SURGICAL PATHOLOGY

## 2021-07-23 ENCOUNTER — Other Ambulatory Visit: Payer: Self-pay

## 2021-07-23 ENCOUNTER — Ambulatory Visit (HOSPITAL_COMMUNITY)
Admission: RE | Admit: 2021-07-23 | Discharge: 2021-07-23 | Disposition: A | Discharge status: Home / Self Care | Payer: PPO | Source: Ambulatory Visit | Attending: Gastroenterology | Admitting: Gastroenterology

## 2021-07-23 DIAGNOSIS — N200 Calculus of kidney: Secondary | ICD-10-CM | POA: Diagnosis not present

## 2021-07-23 DIAGNOSIS — R932 Abnormal findings on diagnostic imaging of liver and biliary tract: Secondary | ICD-10-CM | POA: Diagnosis not present

## 2021-07-23 DIAGNOSIS — Z1211 Encounter for screening for malignant neoplasm of colon: Secondary | ICD-10-CM | POA: Insufficient documentation

## 2021-07-23 DIAGNOSIS — K219 Gastro-esophageal reflux disease without esophagitis: Secondary | ICD-10-CM | POA: Diagnosis not present

## 2021-07-23 DIAGNOSIS — K760 Fatty (change of) liver, not elsewhere classified: Secondary | ICD-10-CM | POA: Diagnosis not present

## 2021-08-03 DIAGNOSIS — Z299 Encounter for prophylactic measures, unspecified: Secondary | ICD-10-CM | POA: Diagnosis not present

## 2021-08-03 DIAGNOSIS — E559 Vitamin D deficiency, unspecified: Secondary | ICD-10-CM | POA: Diagnosis not present

## 2021-08-03 DIAGNOSIS — M797 Fibromyalgia: Secondary | ICD-10-CM | POA: Diagnosis not present

## 2021-08-03 DIAGNOSIS — M25519 Pain in unspecified shoulder: Secondary | ICD-10-CM | POA: Diagnosis not present

## 2021-08-03 DIAGNOSIS — N200 Calculus of kidney: Secondary | ICD-10-CM | POA: Diagnosis not present

## 2021-08-03 DIAGNOSIS — Z2821 Immunization not carried out because of patient refusal: Secondary | ICD-10-CM | POA: Diagnosis not present

## 2021-08-05 ENCOUNTER — Encounter (HOSPITAL_COMMUNITY): Payer: Self-pay | Admitting: Internal Medicine

## 2021-09-16 DIAGNOSIS — M1710 Unilateral primary osteoarthritis, unspecified knee: Secondary | ICD-10-CM | POA: Diagnosis not present

## 2021-09-16 DIAGNOSIS — K219 Gastro-esophageal reflux disease without esophagitis: Secondary | ICD-10-CM | POA: Diagnosis not present

## 2021-09-16 DIAGNOSIS — E7800 Pure hypercholesterolemia, unspecified: Secondary | ICD-10-CM | POA: Diagnosis not present

## 2021-09-16 DIAGNOSIS — M1990 Unspecified osteoarthritis, unspecified site: Secondary | ICD-10-CM | POA: Diagnosis not present

## 2021-09-30 DIAGNOSIS — M797 Fibromyalgia: Secondary | ICD-10-CM | POA: Diagnosis not present

## 2021-09-30 DIAGNOSIS — Z299 Encounter for prophylactic measures, unspecified: Secondary | ICD-10-CM | POA: Diagnosis not present

## 2021-09-30 DIAGNOSIS — Z789 Other specified health status: Secondary | ICD-10-CM | POA: Diagnosis not present

## 2021-09-30 DIAGNOSIS — J069 Acute upper respiratory infection, unspecified: Secondary | ICD-10-CM | POA: Diagnosis not present

## 2021-10-01 ENCOUNTER — Encounter: Payer: Self-pay | Admitting: Gastroenterology

## 2021-10-16 DIAGNOSIS — M1990 Unspecified osteoarthritis, unspecified site: Secondary | ICD-10-CM | POA: Diagnosis not present

## 2021-10-16 DIAGNOSIS — M1710 Unilateral primary osteoarthritis, unspecified knee: Secondary | ICD-10-CM | POA: Diagnosis not present

## 2021-10-16 DIAGNOSIS — K219 Gastro-esophageal reflux disease without esophagitis: Secondary | ICD-10-CM | POA: Diagnosis not present

## 2021-11-26 ENCOUNTER — Other Ambulatory Visit: Payer: Self-pay | Admitting: Internal Medicine

## 2021-11-26 DIAGNOSIS — Z1231 Encounter for screening mammogram for malignant neoplasm of breast: Secondary | ICD-10-CM

## 2021-12-02 ENCOUNTER — Ambulatory Visit: Payer: PPO | Admitting: Gastroenterology

## 2021-12-25 DIAGNOSIS — Z299 Encounter for prophylactic measures, unspecified: Secondary | ICD-10-CM | POA: Diagnosis not present

## 2021-12-25 DIAGNOSIS — Z789 Other specified health status: Secondary | ICD-10-CM | POA: Diagnosis not present

## 2021-12-25 DIAGNOSIS — Z79899 Other long term (current) drug therapy: Secondary | ICD-10-CM | POA: Diagnosis not present

## 2021-12-25 DIAGNOSIS — Z0000 Encounter for general adult medical examination without abnormal findings: Secondary | ICD-10-CM | POA: Diagnosis not present

## 2021-12-25 DIAGNOSIS — R5383 Other fatigue: Secondary | ICD-10-CM | POA: Diagnosis not present

## 2021-12-25 DIAGNOSIS — E7800 Pure hypercholesterolemia, unspecified: Secondary | ICD-10-CM | POA: Diagnosis not present

## 2021-12-25 DIAGNOSIS — Z6829 Body mass index (BMI) 29.0-29.9, adult: Secondary | ICD-10-CM | POA: Diagnosis not present

## 2021-12-25 DIAGNOSIS — Z1331 Encounter for screening for depression: Secondary | ICD-10-CM | POA: Diagnosis not present

## 2021-12-25 DIAGNOSIS — Z1339 Encounter for screening examination for other mental health and behavioral disorders: Secondary | ICD-10-CM | POA: Diagnosis not present

## 2021-12-25 DIAGNOSIS — Z7189 Other specified counseling: Secondary | ICD-10-CM | POA: Diagnosis not present

## 2022-01-20 ENCOUNTER — Encounter: Payer: Self-pay | Admitting: Internal Medicine

## 2022-02-03 ENCOUNTER — Ambulatory Visit: Payer: PPO | Admitting: Gastroenterology

## 2022-02-15 ENCOUNTER — Ambulatory Visit: Payer: PPO | Admitting: Dermatology

## 2022-02-15 DIAGNOSIS — L570 Actinic keratosis: Secondary | ICD-10-CM | POA: Diagnosis not present

## 2022-02-15 DIAGNOSIS — L299 Pruritus, unspecified: Secondary | ICD-10-CM | POA: Diagnosis not present

## 2022-02-15 DIAGNOSIS — L821 Other seborrheic keratosis: Secondary | ICD-10-CM | POA: Diagnosis not present

## 2022-02-15 DIAGNOSIS — Z1283 Encounter for screening for malignant neoplasm of skin: Secondary | ICD-10-CM | POA: Diagnosis not present

## 2022-02-15 DIAGNOSIS — Z86018 Personal history of other benign neoplasm: Secondary | ICD-10-CM | POA: Diagnosis not present

## 2022-02-15 MED ORDER — CLOBETASOL PROPIONATE 0.05 % EX FOAM
Freq: Two times a day (BID) | CUTANEOUS | 3 refills | Status: DC
Start: 2022-02-15 — End: 2022-10-07

## 2022-02-15 NOTE — Patient Instructions (Signed)
Seborrheic Keratosis A seborrheic keratosis is a common, noncancerous (benign) skin growth. These growths are velvety, waxy, rough, tan, brown, or black spots that appear on the skin. These skin growths can be flat or raised, and scaly. What are the causes? The cause of this condition is not known. What increases the risk? You are more likely to develop this condition if you: Have a family history of seborrheic keratosis. Are 50 or older. Are pregnant. Have had estrogen replacement therapy. What are the signs or symptoms? Symptoms of this condition include growths on the face, chest, shoulders, back, or other areas. These growths: Are usually painless, but may become irritated and itchy. Can be yellow, brown, black, or other colors. Are slightly raised or have a flat surface. Are sometimes rough or wart-like in texture. Are often velvety or waxy on the surface. Are round or oval-shaped. Often occur in groups, but may occur as a single growth. How is this diagnosed? This condition is diagnosed with a medical history and physical exam. A sample of the growth may be tested (skin biopsy). You may need to see a skin specialist (dermatologist). How is this treated? Treatment is not usually needed for this condition, unless the growths are irritated or bleed often. You may also choose to have the growths removed if you do not like their appearance. Most commonly, these growths are treated with a procedure in which liquid nitrogen is applied to "freeze" off the growth (cryosurgery). They may also be burned off with electricity (electrocautery) or removed by scraping (curettage). Follow these instructions at home: Watch your growth for any changes. Keep all follow-up visits as told by your health care provider. This is important. Do not scratch or pick at the growth or growths. This can cause them to become irritated or infected. Contact a health care provider if: You suddenly have many new  growths. Your growth bleeds, itches, or hurts. Your growth suddenly becomes larger or changes color. Summary A seborrheic keratosis is a common, noncancerous (benign) skin growth. Treatment is not usually needed for this condition, unless the growths are irritated or bleed often. Watch your growth for any changes. Contact a health care provider if you suddenly have many new growths or your growth suddenly becomes larger or changes color. Keep all follow-up visits as told by your health care provider. This is important. This information is not intended to replace advice given to you by your health care provider. Make sure you discuss any questions you have with your health care provider. Document Revised: 07/29/2021 Document Reviewed: 07/29/2021 Elsevier Patient Education  2023 Elsevier Inc.  

## 2022-03-03 ENCOUNTER — Encounter: Payer: Self-pay | Admitting: Dermatology

## 2022-03-05 NOTE — Progress Notes (Signed)
   Follow-Up Visit   Subjective  Dawn Hammond is a 56 y.o. female who presents for the following: Annual Exam (Here for skin exam. Concerns left breast. Patient has a photo it has since gotten better. Check left ear. Can feel it and it itches. Tx anti itch cream. Check dark lesion on left jaw line. Ozzie Hoyle of atypical mole. ).  General skin examination, concern with spots on left breast and lower cheek. Location:  Duration:  Quality:  Associated Signs/Symptoms: Modifying Factors:  Severity:  Timing: Context:   Objective  Well appearing patient in no apparent distress; mood and affect are within normal limits. Full body skin examination: No atypical pigmented lesions (all checked with dermoscopy).  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  Left Parotid Area Gritty 3 mm pink crust    A full examination was performed including scalp, head, eyes, ears, nose, lips, neck, chest, axillae, abdomen, back, buttocks, bilateral upper extremities, bilateral lower extremities, hands, feet, fingers, toes, fingernails, and toenails. All findings within normal limits unless otherwise noted below.  Areas beneath undergarments not fully examined.   Assessment & Plan    Encounter for screening for malignant neoplasm of skin  Annual skin examination, encouraged to self examine twice annually.  Continued ultraviolet protection.  Seborrheic keratosis Left Breast  I offered to obtain confirmatory biopsy but patient content to leave lesion unless there is obvious clinical change  Itch (2) Left Mid Helix; Mid Occipital Scalp  Topical clobetasol foam daily for maximum 4 weeks, taper if improves.  Avoid use on face.  clobetasol (OLUX) 0.05 % topical foam - Left Mid Helix, Mid Occipital Scalp Apply topically 2 (two)  times daily.  AK (actinic keratosis) Left Parotid Area  Destruction of lesion - Left Parotid Area Complexity: simple   Destruction method: cryotherapy   Informed consent: discussed and consent obtained   Timeout:  patient name, date of birth, surgical site, and procedure verified Lesion destroyed using liquid nitrogen: Yes   Cryotherapy cycles:  3 Outcome: patient tolerated procedure well with no complications        I, [name], MD, have reviewed all documentation for this visit.  The documentation on 03/05/22 for the exam, diagnosis, procedures, and orders are all accurate and complete.

## 2022-03-26 ENCOUNTER — Other Ambulatory Visit: Payer: Self-pay | Admitting: Internal Medicine

## 2022-04-02 ENCOUNTER — Ambulatory Visit: Payer: PPO | Admitting: Gastroenterology

## 2022-04-07 ENCOUNTER — Ambulatory Visit
Admission: RE | Admit: 2022-04-07 | Discharge: 2022-04-07 | Disposition: A | Discharge status: Home / Self Care | Payer: PPO | Source: Ambulatory Visit | Attending: Internal Medicine | Admitting: Internal Medicine

## 2022-04-07 DIAGNOSIS — Z1231 Encounter for screening mammogram for malignant neoplasm of breast: Secondary | ICD-10-CM | POA: Diagnosis not present

## 2022-04-18 NOTE — Progress Notes (Signed)
Referring Provider: Monico Blitz, MD Primary Care Physician:  Monico Blitz, MD Primary GI Physician: Dr. Gala Romney  Chief Complaint  Patient presents with   Follow-up    HPI:   Dawn Hammond is a 56 y.o. female presenting today for follow-up of GERD and dysphagia. Also has history of fatty liver.   Last seen in our office 07/14/2021.  She was on Protonix 40 mg daily and Pepcid 20 mg daily for chronic GERD.  Reported burping food back up, some nausea, some solid food dysphagia. No abdominal pain. Bowels moving well with stool softener daily. No alarm symptoms.  Patient was asking about following up on her "liver disease".  She had a CT back in 2012 with small lobular area of the margins of the liver raising possibility of developing cirrhosis.  This CT had previously been reviewed with the radiologist who felt this was an overcall. Plan included EGD, screening colonoscopy, and abdominal ultrasound.  Ultrasound completed 07/23/2021 with hepatic steatosis.  Procedures completed 07/20/2021: EGD: Mild Schatzki's ring, small hiatal hernia, gastric polyp s/p biopsy, normal examined duodenum.  Recommended increasing Protonix to 40 mg twice daily.  Pathology revealed fundic gland polyp, negative for dysplasia. Colonoscopy: Normal exam.  Repeat in 10 years.  Today:   GERD has improved.  Occasional breakthrough symptoms of overeating.  This occurs maybe 3 times a month.  Denies abdominal pain, nausea, vomiting.  Dysphagia is a little better, but did not resolve completely.  However, its only occurred a handful of times since EGD in October.  Does not want to pursue any further evaluation at this time.   Fatty liver:  Down 5 lbs since last visit. Watching what she eats and portion sizes. Limiting fried foods. Started making dietary changes in February/March. No alcohol.   Denies abdominal distention, lower extremity edema, confusion/mental status changes, yellowing of the eyes or  skin.  Denies brbpr or melena. Occasional constipation but usually bowels move well with stool softeners daily. Alternates 1-2 daily.     Past Medical History:  Diagnosis Date   Chronic sinusitis    COPD (chronic obstructive pulmonary disease) (HCC)    emphysema   Fibromyalgia    GERD (gastroesophageal reflux disease)    Impaired hearing    Pneumonia, viral 1984   x2 tracheostomy/ Collapsed lung   PONV (postoperative nausea and vomiting)    after septoplasty surgery   Sleep apnea    does not wear a CPAP-very mild   Urticaria     Past Surgical History:  Procedure Laterality Date   APPENDECTOMY  2015   BIOPSY  07/20/2021   Procedure: BIOPSY;  Surgeon: Daneil Dolin, MD;  [facility];  Service: Endoscopy;;   COLONOSCOPY  08/11/2011   Rourk: Normal   COLONOSCOPY WITH PROPOFOL N/A 07/20/2021   Surgeon: Daneil Dolin, MD;   Normal exam.  Repeat in 10 years.   ESOPHAGOGASTRODUODENOSCOPY  08/18/2010   noncritical schatzki ring s/p dilation, small hh, biopsy negative for EE   ESOPHAGOGASTRODUODENOSCOPY (EGD) WITH PROPOFOL N/A 07/20/2021   Surgeon: Daneil Dolin, MD;  Mild Schatzki's ring, small hiatal hernia, gastric polyp s/p biopsy, normal examined duodenum.   MALONEY DILATION N/A 07/20/2021   Procedure: Venia Minks DILATION;  Surgeon: Daneil Dolin, MD;  [facility];  Service: Endoscopy;  Laterality: N/A;   NASAL SEPTOPLASTY W/ TURBINOPLASTY  03/06/2012   Procedure: NASAL SEPTOPLASTY WITH TURBINATE REDUCTION;  Surgeon: Jodi Marble, MD;  Location: [facility];  Service: ENT;  Laterality: Bilateral;   TOTAL ABDOMINAL HYSTERECTOMY     tracheostomy  10/18/1982    Current Outpatient Medications  Medication Sig Dispense Refill   Cholecalciferol (VITAMIN D3) 125 MCG (5000 UT) CAPS Take 5,000 Units by mouth daily.     clobetasol (OLUX) 0.05 % topical foam Apply topically 2 (two) times daily. 100 g 3   estradiol (ESTRACE) 0.5 MG tablet Take  0.5 mg by mouth daily as needed (pt preference).     famotidine (PEPCID) 20 MG tablet Take 20 mg by mouth daily.      ibuprofen (ADVIL,MOTRIN) 200 MG tablet Take 200 mg by mouth every 6 (six) hours as needed for moderate pain.     Methylcobalamin (B-12) 5000 MCG TBDP Take 5,000 mcg by mouth daily.     pimecrolimus (ELIDEL) 1 % cream      tiZANidine (ZANAFLEX) 4 MG tablet Take 4 mg by mouth every 6 (six) hours as needed for muscle spasms.     vitamin C (ASCORBIC ACID) 500 MG tablet Take 500 mg by mouth daily.     vitamin E 400 UNIT capsule Take 400 Units by mouth daily.       pantoprazole (PROTONIX) 40 MG tablet Take 1 tablet (40 mg total) by mouth 2 (two) times daily. 180 tablet 1   No current facility-administered medications for this visit.    Allergies as of 04/19/2022 - Review Complete 04/19/2022  Allergen Reaction Noted   Amoxicillin Rash 07/22/2010   Clindamycin/lincomycin Rash 03/01/2012   Dilaudid [hydromorphone] Rash 07/14/2021   Hydrocodone Itching, Nausea And Vomiting, and Rash    Oxycodone hcl Itching, Nausea And Vomiting, and Rash 07/22/2010    Family History  Problem Relation Age of Onset   Breast cancer Maternal Aunt    Uterine cancer Maternal Aunt    Kidney cancer Mother    Colon cancer Neg Hx     Social History   Socioeconomic History   Marital status: Widowed    Spouse name: Ronalee Belts   Number of children: 0   Years of education: Not on file   Highest education level: GED or equivalent  Occupational History   Occupation: retired due to hearing impairment  Tobacco Use   Smoking status: Never   Smokeless tobacco: Never  Vaping Use   Vaping Use: Never used  Substance and Sexual Activity   Alcohol use: No   Drug use: No   Sexual activity: Not on file  Other Topics Concern   Not on file  Social History Narrative   Patient is right-handed. She lives with her husband ina one level home. She drinks 3 cups of coffee a day. She does not exercise.   Social  Determinants of Health   Financial Resource Strain: Not on file  Food Insecurity: Not on file  Transportation Needs: Not on file  Physical Activity: Not on file  Stress: Not on file  Social Connections: Not on file    Review of Systems: Gen: Denies fever, chills, cold or flulike symptoms, presyncope, syncope. CV: Denies chest pain, palpitations. Resp: Denies dyspnea, cough. GI: See HPI Heme:See HPI  Physical Exam: BP 122/77 (BP Location: Right Arm, Patient Position: Sitting, Cuff Size: Large)   Pulse 79   Temp 97.6 F (36.4 C) (Oral)   Ht '5\' 5"'$  (1.651 m)   Wt 162 lb 9.6 oz (73.8 kg)   SpO2 94%   BMI 27.06 kg/m  General:   Alert and oriented. No distress noted. Pleasant and cooperative.  Head:  Normocephalic and atraumatic. Eyes:  Conjuctiva clear without scleral icterus. Heart:  S1, S2 present without murmurs appreciated. Lungs:  Clear to auscultation bilaterally. No wheezes, rales, or rhonchi. No distress.  Abdomen:  +BS, soft, and non-distended. Mild TTP in epigastric area.  No rebound or guarding. No HSM or masses noted. Msk:  Symmetrical without gross deformities. Normal posture. Extremities:  Without edema. Neurologic:  Alert and  oriented x4 Psych:  Normal mood and affect.    Assessment:  56 year old female with GI history of fatty liver, mild constipation, GERD, dysphagia, presenting today for follow-up of GERD, dysphagia, fatty liver s/p EGD completed 07/20/2021 revealing mild Schatzki's ring, small hiatal hernia, fundic gland polyp, normal examined duodenum.  Recommended increasing Protonix to twice daily.   GERD: Improved with Protonix 40 mg twice daily.  Occasional breakthrough symptoms if overeating.  Discussed GERD diet/lifestyle including eating 4-6 small meals daily.  Advised that she could use Pepcid 20 mg as needed for breakthrough symptoms.  Dysphagia: Somewhat improved following EGD; however, it does not appear that she had dilation of the mild  Schatzki's ring that was noted.  She reports she has only had a few episodes of foods feeling like they are "sticking" in her chest since October, and she does not desire any further evaluation at this time.  Dysphagia precautions discussed.  Fatty liver: Clinically doing well without any signs or symptoms of decompensation.  She is down 5 pounds in the last 10 months though she reports she just started dietary changes around February or March.  No recent LFTs on file.  We will request any recent labs from PCP for review.  She was also counseled on fatty liver with recommendations for ongoing weight loss through diet and exercise.  Colon cancer screening: Colonoscopy completed 07/20/2021 with normal exam.  Due for repeat in 2032.   Plan:  Continue Protonix 40 mg twice daily 30 minutes before breakfast and dinner. Use Pepcid 20 mg as needed for breakthrough GERD symptoms. Reinforced GERD diet/lifestyle.  Separate written instructions provided. Eat slowly, take small bites, chew thoroughly, avoid tough textures, chopped meats finely, drink plenty of liquids throughout meals. Patient will let me know of any worsening dysphagia symptoms. Counseled on fatty liver.  Discussed treatment of fatty liver with weight loss through diet and exercise.  Separate written instructions/handout provided. Request recent labs from PCP.  If LFTs have not been updated in the last 6 months, we will arrange for this. Follow-up in 6 months or sooner if needed.   Aliene Altes, PA-C [facility] 04/19/2022

## 2022-04-19 ENCOUNTER — Ambulatory Visit: Payer: PPO | Admitting: Gastroenterology

## 2022-04-19 ENCOUNTER — Encounter: Payer: Self-pay | Admitting: Gastroenterology

## 2022-04-19 VITALS — BP 122/77 | HR 79 | Temp 97.6°F | Ht 65.0 in | Wt 162.6 lb

## 2022-04-19 DIAGNOSIS — K760 Fatty (change of) liver, not elsewhere classified: Secondary | ICD-10-CM | POA: Diagnosis not present

## 2022-04-19 DIAGNOSIS — K219 Gastro-esophageal reflux disease without esophagitis: Secondary | ICD-10-CM | POA: Diagnosis not present

## 2022-04-19 DIAGNOSIS — R1310 Dysphagia, unspecified: Secondary | ICD-10-CM | POA: Diagnosis not present

## 2022-04-19 MED ORDER — PANTOPRAZOLE SODIUM 40 MG PO TBEC: 40.0000 mg | DELAYED_RELEASE_TABLET | Freq: Two times a day (BID) | ORAL | 1 refills | Status: DC

## 2022-04-19 NOTE — Patient Instructions (Addendum)
Continue Protonix 40 mg twice daily 30 minutes before breakfast and dinner.  Use Pepcid 20 mg as needed for breakthrough heartburn symptoms.  Follow a GERD diet:  Avoid fried, fatty, greasy, spicy, citrus foods. Avoid caffeine and carbonated beverages. Avoid chocolate. Try eating 4-6 small meals a day rather than 3 large meals. Do not eat within 3 hours of laying down. Prop head of bed up on wood or bricks to create a 6 inch incline.  Swallowing precautions:  Eat slowly, take small bites, chew thoroughly, drink plenty of liquids throughout meals.  Avoid trough textures All meats should be chopped finely.  If you have any worsening symptoms, please let me know.   Instructions for fatty liver: Recommend 1-2# weight loss per week until ideal body weight through exercise & diet. To get back to a "normal" BMI, you will need to lose down to about 148 lbs.  Slow steady weight loss is the best way to go. You are doing a great job.  Low fat/cholesterol diet.   Avoid sweets, sodas, fruit juices, sweetened beverages like tea, etc. Gradually increase exercise from 15 min daily up to 1 hr per day 5 days/week. Limit alcohol use.   I am requesting recent blood work from your primary care for review.  If you have not had any liver enzymes checked in the last 6 months, we will arrange for this.  We will plan to see back in 6 months.  Do not hesitate to call if you have any questions or concerns prior to your next visit.  It was nice meeting you today!   Aliene Altes, PA-C [facility]

## 2022-04-22 ENCOUNTER — Telehealth: Payer: Self-pay | Admitting: Gastroenterology

## 2022-04-22 NOTE — Telephone Encounter (Signed)
Courtney please let patient know I received and reviewed labs completed with PCP dated 12/26/2021.  LFTs were entirely normal.  Recommend checking liver enzymes with PCP every 6 months and notify us if liver enzymes increase.

## 2022-04-23 NOTE — Telephone Encounter (Signed)
Spoke to pt, informed her of results and recommendations. Pt voiced understanding.  

## 2022-05-05 ENCOUNTER — Other Ambulatory Visit: Payer: Self-pay | Admitting: Gastroenterology

## 2022-05-05 ENCOUNTER — Telehealth: Payer: Self-pay

## 2022-05-05 DIAGNOSIS — K5904 Chronic idiopathic constipation: Secondary | ICD-10-CM

## 2022-05-05 MED ORDER — LINACLOTIDE 72 MCG PO CAPS: 72.0000 ug | ORAL_CAPSULE | Freq: Every day | ORAL | 5 refills | Status: DC

## 2022-05-05 NOTE — Telephone Encounter (Signed)
Was like Linzess samples were given back in September 2022.  I will send in a prescription.

## 2022-05-05 NOTE — Telephone Encounter (Signed)
Pt called stating that the Linzess 72 worked well and would like for an rx to be sent in to [facility]

## 2022-05-06 NOTE — Telephone Encounter (Signed)
Pt was made aware.  

## 2022-06-30 DIAGNOSIS — R509 Fever, unspecified: Secondary | ICD-10-CM | POA: Diagnosis not present

## 2022-06-30 DIAGNOSIS — Z789 Other specified health status: Secondary | ICD-10-CM | POA: Diagnosis not present

## 2022-06-30 DIAGNOSIS — Z299 Encounter for prophylactic measures, unspecified: Secondary | ICD-10-CM | POA: Diagnosis not present

## 2022-06-30 DIAGNOSIS — R5383 Other fatigue: Secondary | ICD-10-CM | POA: Diagnosis not present

## 2022-06-30 DIAGNOSIS — Z6829 Body mass index (BMI) 29.0-29.9, adult: Secondary | ICD-10-CM | POA: Diagnosis not present

## 2022-06-30 DIAGNOSIS — J399 Disease of upper respiratory tract, unspecified: Secondary | ICD-10-CM | POA: Diagnosis not present

## 2022-08-20 DIAGNOSIS — H903 Sensorineural hearing loss, bilateral: Secondary | ICD-10-CM | POA: Diagnosis not present

## 2022-08-25 ENCOUNTER — Encounter: Payer: Self-pay | Admitting: Internal Medicine

## 2022-09-07 DIAGNOSIS — Z299 Encounter for prophylactic measures, unspecified: Secondary | ICD-10-CM | POA: Diagnosis not present

## 2022-09-07 DIAGNOSIS — R0981 Nasal congestion: Secondary | ICD-10-CM | POA: Diagnosis not present

## 2022-09-07 DIAGNOSIS — J069 Acute upper respiratory infection, unspecified: Secondary | ICD-10-CM | POA: Diagnosis not present

## 2022-09-24 IMAGING — US US ABDOMEN COMPLETE
1 series · 13 of 25 positions shown · non-contrast
Comparison: Prior CT scan the abdomen and pelvis 07/06/2011

CLINICAL DATA: Abnormal CT scan of the liver

EXAM:
ABDOMEN ULTRASOUND COMPLETE

[Series 1: us abdomen complete · 13 of 183 slices shown]
[im 1/183]
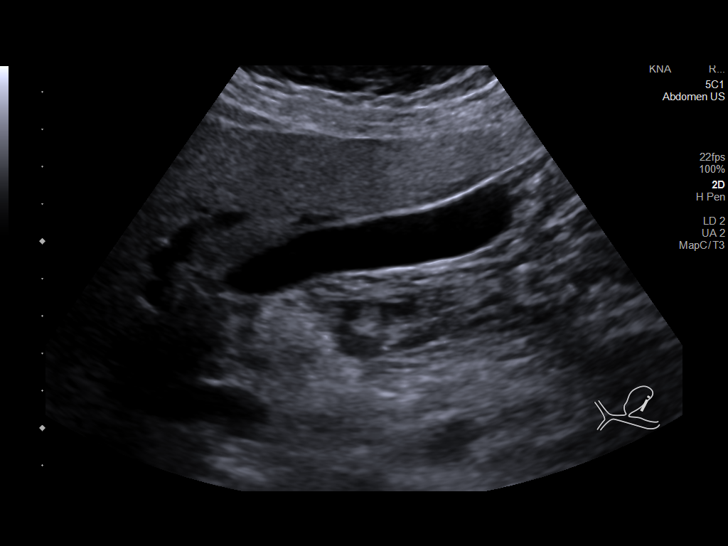
[im 16/183]
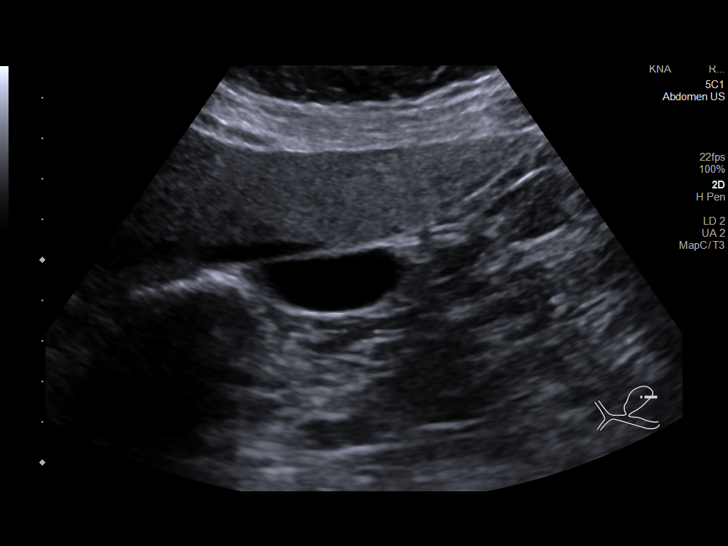
[im 31/183]
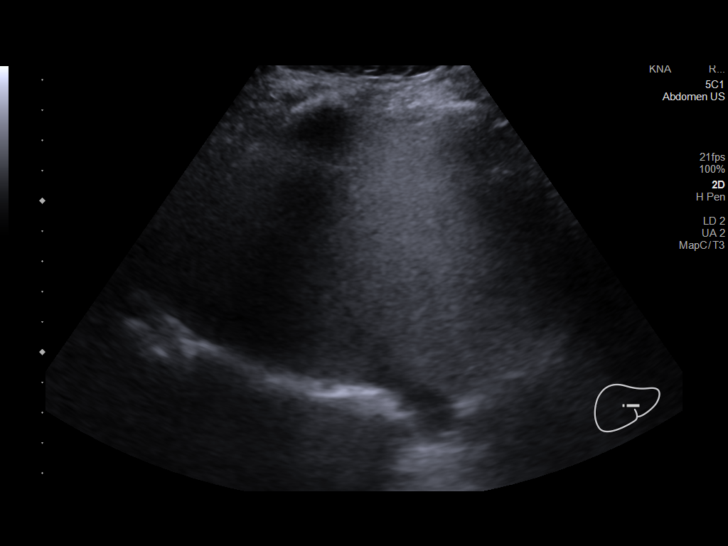
[im 46/183]
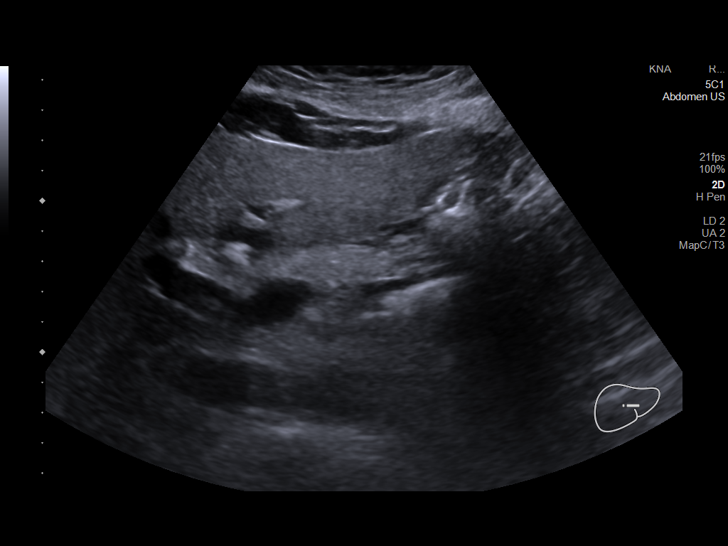
[im 61/183]
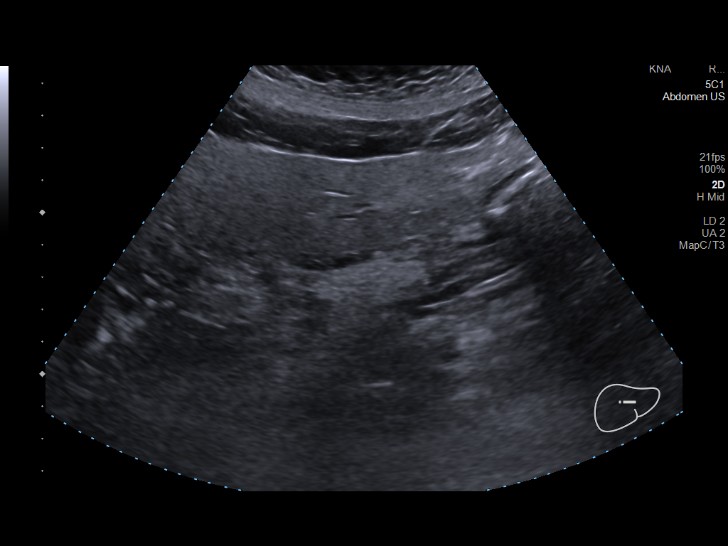
[im 76/183]
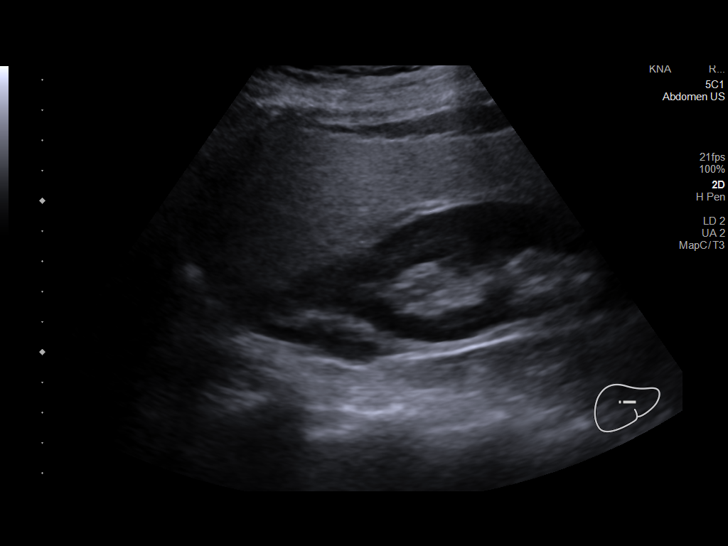
[im 92/183]
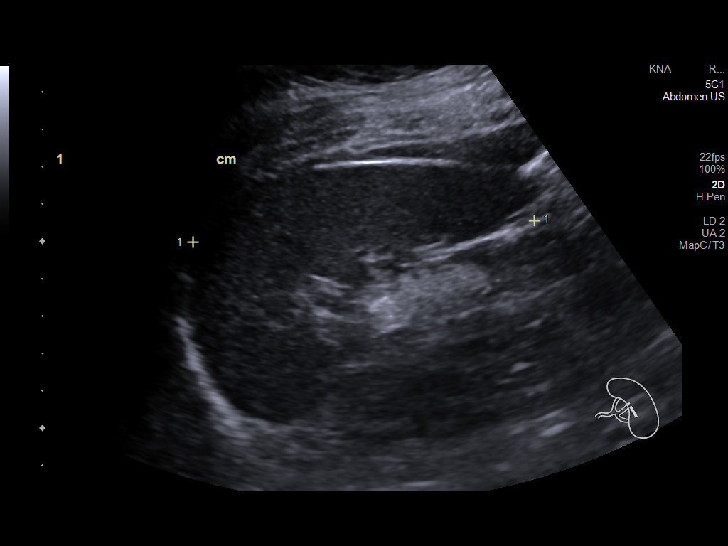
[im 107/183]
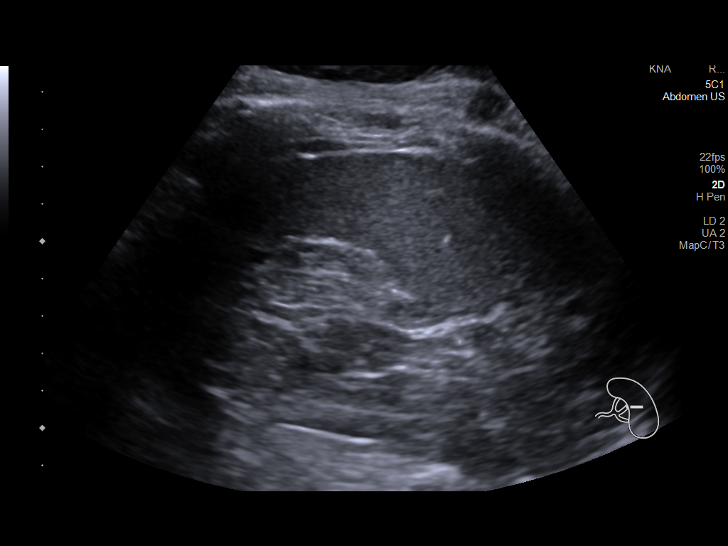
[im 122/183]
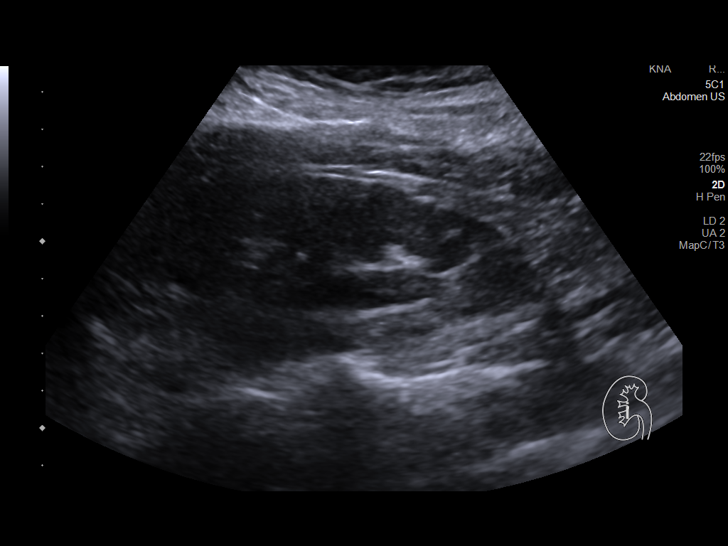
[im 137/183]
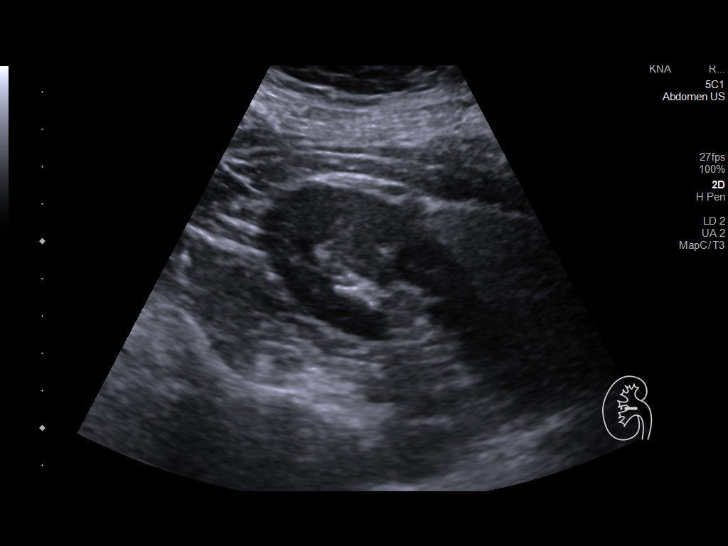
[im 152/183]
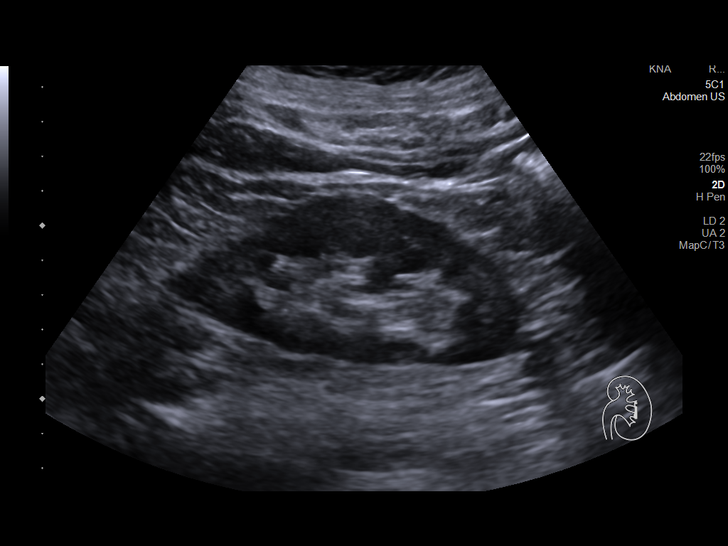
[im 167/183]
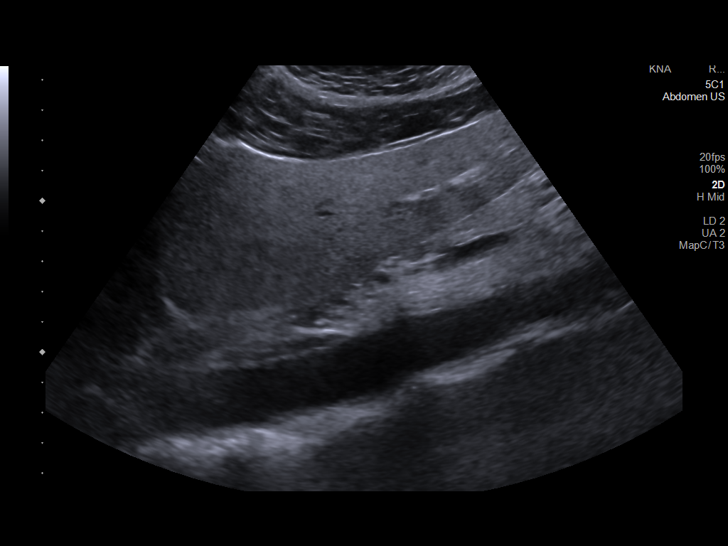
[im 183/183]
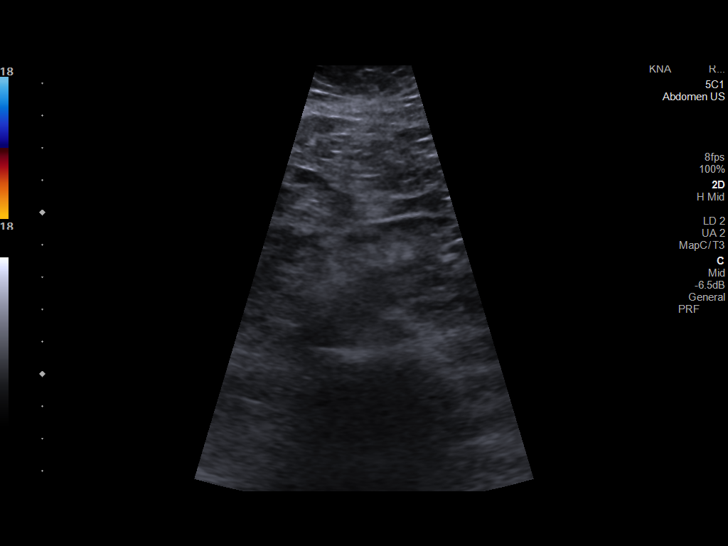

[13 of 25 positions shown; findings below may reference images not displayed]

FINDINGS: Gallbladder: No gallstones or wall thickening visualized. No
sonographic Murphy sign noted by sonographer.

Common bile duct: Diameter: Normal at 6-7 mm

Liver: Increased echogenicity of the hepatic parenchyma with slight
coarsening of the echotexture. The adjacent renal parenchyma appears
hypoechoic in comparison. No discrete hepatic lesion identified.
Portal vein is patent on color Doppler imaging with normal direction
of blood flow towards the liver.

IVC: No abnormality visualized.

Pancreas: Visualized portion unremarkable.

Spleen: Size and appearance within normal limits.

Right Kidney: Length: 11.3 cm. Normal echogenicity. Echogenic 7 mm
focus in the upper pole demonstrates posterior acoustic shadowing
and likely reflects a renal stone. There is color comet tail
artifact.

Left Kidney: Length: 0.9 cm. Echogenicity within normal limits. No
mass or hydronephrosis visualized.

Abdominal aorta: No aneurysm visualized.

Other findings: None.
IMPRESSION: 1. Hepatic steatosis.
2. Approximately 7 mm right upper pole nonobstructing renal stone.
3. No evidence of cholelithiasis or biliary ductal dilatation.

## 2022-10-06 NOTE — Progress Notes (Signed)
GI Office Note    Referring Provider: [name], MD Primary Care Physician:  [name], MD Primary Gastroenterologist: Gerrit Friends.Rourk, MD  Date:  10/07/2022  ID:  Dawn Hammond, DOB Apr 03, 1966, MRN [id number]   Chief Complaint   Chief Complaint  Patient presents with   Follow-up    History of Present Illness  Dawn Hammond is a 56 y.o. female with a history of fatty liver, GERD, dysphagia, fibromyalgia, COPD, previous tracheostomy, and mild sleep apnea presenting today for follow-up.  Prior endoscopic workup 07/20/2021 EGD: -Mild Schatzki's ring -Small hiatal hernia -Gastric polyp s/p biopsy -Normal examined duodenum -Advised to increase pantoprazole to 40 mg twice daily -Pathology revealed fundic gland polyp, negative for dysplasia Colonoscopy: -Normal exam -Repeat in 10 years.  Ultrasound 07/23/2021: Hepatic steatosis  Last in the office 04/19/2022.  Noted improvement in GERD with occasional breakthrough symptoms of overeating with about 3 occurrences per month.  Dysphagia not completely resolved but somewhat better.  Declined further evaluation.  Had lost 5 pounds since last visit with dietary changes and decreasing portions.  Denies any alcohol use.  Denied abdominal distention, lower extremity edema, confusion/mental status changes, jaundice, or pruritus.  Also without any BRBPR or melena.  Occasional constipation with a BM every 1-2 days, taking daily stool softener.  She was advised to continue PPI twice daily, Pepcid for breakthrough GERD symptoms, diocese lifestyle modifications reinforced, dysphagia precautions discussed.  Fatty liver handout provided.  Labs requested from PCP.  Advised to follow-up in 6 months.  Labs were reviewed by Ermalinda Memos, PA noted to have normal LFTs in March 2023.  She was recommended to have LFTs checked every 6 months with PCP.   Today: Constipation - Having a BM almost every day. Taking Linzess 72 mcg as  needed - thinks it constipates her some more. Taking 2 stool softeners nightly. Has occasional straining but not often. Bread can constipation. Drinks about 3-4 bottles water daily.   GERD - Mostly controlled on pantoprazole 40 mg twice daily. Not having severe breakthrough symptoms. Trying to make sure she overeats. Denies dysphagia, rare symptoms.   Fatty liver - Has been working on weight loss. Trying to watch her portion sizes but not following a strict diet. Is not exercising currently.  Denies any mental status changes, confusion, pruritus, jaundice.  Vitamin B supplements have been helping.    Wt Readings from Last 3 Encounters:  10/07/22 159 lb 6.4 oz (72.3 kg)  04/19/22 162 lb 9.6 oz (73.8 kg)  07/14/21 167 lb 3.2 oz (75.8 kg)    Current Outpatient Medications  Medication Sig Dispense Refill   Cholecalciferol (VITAMIN D3) 125 MCG (5000 UT) CAPS Take 5,000 Units by mouth daily.     ibuprofen (ADVIL,MOTRIN) 200 MG tablet Take 200 mg by mouth every 6 (six) hours as needed for moderate pain.     linaclotide (LINZESS) 72 MCG capsule Take 1 capsule (72 mcg total) by mouth daily before breakfast. 30 capsule 5   Methylcobalamin (B-12) 5000 MCG TBDP Take 5,000 mcg by mouth daily.     OVER THE COUNTER MEDICATION Use as directed 100 mg in the mouth or throat daily. Equate stool softener     vitamin C (ASCORBIC ACID) 500 MG tablet Take 500 mg by mouth daily.     vitamin E 400 UNIT capsule Take 400 Units by mouth daily.       famotidine (PEPCID) 20 MG tablet Take 20 mg by mouth daily.  (Patient not taking:  Reported on 10/07/2022)     pantoprazole (PROTONIX) 40 MG tablet Take 1 tablet (40 mg total) by mouth 2 (two) times daily. 180 tablet 1   No current facility-administered medications for this visit.    Past Medical History:  Diagnosis Date   Chronic sinusitis    COPD (chronic obstructive pulmonary disease) (HCC)    emphysema   Fibromyalgia    GERD (gastroesophageal reflux disease)     Impaired hearing    Pneumonia, viral 1984   x2 tracheostomy/ Collapsed lung   PONV (postoperative nausea and vomiting)    after septoplasty surgery   Sleep apnea    does not wear a CPAP-very mild   Urticaria     Past Surgical History:  Procedure Laterality Date   APPENDECTOMY  2015   BIOPSY  07/20/2021   Procedure: BIOPSY;  Surgeon: Corbin Ade, MD;  [facility];  Service: Endoscopy;;   COLONOSCOPY  08/11/2011   Rourk: Normal   COLONOSCOPY WITH PROPOFOL N/A 07/20/2021   Surgeon: Corbin Ade, MD;   Normal exam.  Repeat in 10 years.   ESOPHAGOGASTRODUODENOSCOPY  08/18/2010   noncritical schatzki ring s/p dilation, small hh, biopsy negative for EE   ESOPHAGOGASTRODUODENOSCOPY (EGD) WITH PROPOFOL N/A 07/20/2021   Surgeon: Corbin Ade, MD;  Mild Schatzki's ring, small hiatal hernia, gastric polyp s/p biopsy, normal examined duodenum.   MALONEY DILATION N/A 07/20/2021   Procedure: Elease Hashimoto DILATION;  Surgeon: Corbin Ade, MD;  [facility];  Service: Endoscopy;  Laterality: N/A;   NASAL SEPTOPLASTY W/ TURBINOPLASTY  03/06/2012   Procedure: NASAL SEPTOPLASTY WITH TURBINATE REDUCTION;  Surgeon: Flo Shanks, MD;  Location: [facility];  Service: ENT;  Laterality: Bilateral;   TOTAL ABDOMINAL HYSTERECTOMY     tracheostomy  10/18/1982    Family History  Problem Relation Age of Onset   Breast cancer Maternal Aunt    Uterine cancer Maternal Aunt    Kidney cancer Mother    Colon cancer Neg Hx     Allergies as of 10/07/2022 - Review Complete 10/07/2022  Allergen Reaction Noted   Amoxicillin Rash 07/22/2010   Clindamycin/lincomycin Rash 03/01/2012   Dilaudid [hydromorphone] Rash 07/14/2021   Hydrocodone Itching, Nausea And Vomiting, and Rash    Oxycodone hcl Itching, Nausea And Vomiting, and Rash 07/22/2010    Social History   Socioeconomic History   Marital status: Widowed    Spouse name: Kathlene November   Number of children: 0    Years of education: Not on file   Highest education level: GED or equivalent  Occupational History   Occupation: retired due to hearing impairment  Tobacco Use   Smoking status: Never   Smokeless tobacco: Never  Vaping Use   Vaping Use: Never used  Substance and Sexual Activity   Alcohol use: No   Drug use: No   Sexual activity: Not on file  Other Topics Concern   Not on file  Social History Narrative   Patient is right-handed. She lives with her husband ina one level home. She drinks 3 cups of coffee a day. She does not exercise.   Social Determinants of Health   Financial Resource Strain: Not on file  Food Insecurity: Not on file  Transportation Needs: Not on file  Physical Activity: Not on file  Stress: Not on file  Social Connections: Not on file     Review of Systems   Gen: Denies fever, chills, anorexia. Denies fatigue, weakness, weight loss.  CV: Denies chest pain, palpitations, syncope, peripheral edema, and claudication. Resp: Denies dyspnea at rest, cough, wheezing, coughing up blood, and pleurisy. GI: See HPI Derm: Denies rash, itching, dry skin Psych: Denies depression, anxiety, memory loss, confusion. No homicidal or suicidal ideation.  Heme: Denies bruising, bleeding, and enlarged lymph nodes.   Physical Exam   BP 128/86 (BP Location: Right Arm, Patient Position: Sitting, Cuff Size: Normal)   Pulse 82   Temp 97.6 F (36.4 C) (Temporal)   Ht 5\' 5"  (1.651 m)   Wt 159 lb 6.4 oz (72.3 kg)   SpO2 95%   BMI 26.53 kg/m   General:   Alert and oriented. No distress noted. Pleasant and cooperative.  Head:  Normocephalic and atraumatic. Eyes:  Conjuctiva clear without scleral icterus. Mouth:  Oral mucosa pink and moist. Good dentition. No lesions. Lungs:  Clear to auscultation bilaterally. No wheezes, rales, or rhonchi. No distress.  Heart:  S1, S2 present without murmurs appreciated.  Abdomen:  +BS, soft, non-distended. Mild Ttp to epigastrium. No rebound  or guarding. No HSM or masses noted. Rectal: deferred Msk:  Symmetrical without gross deformities. Normal posture. Extremities:  Without edema. Neurologic:  Alert and  oriented x4 Psych:  Alert and cooperative. Normal mood and affect.   Assessment  Dawn Hammond is a 56 y.o. female with a history of fatty liver, GERD, dysphagia, fibromyalgia, COPD, previous tracheostomy, and mild sleep apnea presenting today for follow-up.  GERD, Dysphagia: GERD fairly well-controlled on pantoprazole 40 mg twice daily.  Has rare breakthrough symptoms of overeating.  Has been working on maintaining her portion control.  Pantoprazole refilled today.  GERD diet societal modifications reinforced.  No complaints of dysphagia.  Fatty liver: No signs of decompensation.  Doing well overall.  She is down 3 pounds since her last visit 6 months ago.  Admits to not exercising but has been working on portion control and making dietary changes.  LFTs normal in March 2023, will recheck today given it has been more than 6 months.  She was provided education regarding fatty liver and recommended ongoing weight loss with diet and exercise.  Constipation: Typically having a bowel movement almost daily without the need to strain.  Has been taking 2 stool softeners nightly.  Only using Linzess 72 mcg as needed as sometimes she feels like this may make her constipation worse.  We discussed trialing a new medication if her constipation begins to worsen.  PLAN   HFP Continue Linzess 72 mcg daily as needed Continue daily stool softeners.  Continue pantoprazole 40 mg twice daily, refilled today. GERD diet/lifestyle modifications Exercise at least 15-30 minutes 3-5 days per week to assist in weight loss.  Low-fat/low-cholesterol diet. Limit alcohol use. Continue to work toward weight loss Follow up in 6 months.     Brooke Bonito, MSN, FNP-BC, AGACNP-BC [facility]

## 2022-10-07 ENCOUNTER — Ambulatory Visit (INDEPENDENT_AMBULATORY_CARE_PROVIDER_SITE_OTHER): Payer: PPO | Admitting: Gastroenterology

## 2022-10-07 ENCOUNTER — Encounter: Payer: Self-pay | Admitting: Gastroenterology

## 2022-10-07 VITALS — BP 128/86 | HR 82 | Temp 97.6°F | Ht 65.0 in | Wt 159.4 lb

## 2022-10-07 DIAGNOSIS — K5904 Chronic idiopathic constipation: Secondary | ICD-10-CM

## 2022-10-07 DIAGNOSIS — K760 Fatty (change of) liver, not elsewhere classified: Secondary | ICD-10-CM | POA: Diagnosis not present

## 2022-10-07 DIAGNOSIS — K219 Gastro-esophageal reflux disease without esophagitis: Secondary | ICD-10-CM | POA: Diagnosis not present

## 2022-10-07 DIAGNOSIS — R1319 Other dysphagia: Secondary | ICD-10-CM | POA: Diagnosis not present

## 2022-10-07 MED ORDER — PANTOPRAZOLE SODIUM 40 MG PO TBEC: 40.0000 mg | DELAYED_RELEASE_TABLET | Freq: Two times a day (BID) | ORAL | 1 refills | Status: DC

## 2022-10-07 NOTE — Patient Instructions (Addendum)
Continue pantoprazole 40 mg twice daily.  I sent a refill for you today. Follow a GERD diet:  Avoid fried, fatty, greasy, spicy, citrus foods. Avoid caffeine and carbonated beverages. Avoid chocolate. Try eating 4-6 small meals a day rather than 3 large meals. Do not eat within 3 hours of laying down. Prop head of bed up on wood or bricks to create a 6 inch incline.  For your constipation you may continue your stool softeners 1-2 daily.  May also continue to use Linzess as needed, if her constipation worsens, please reach out as we can try a different medication for chronic constipation.  Instructions for fatty liver: Recommend 1-2# weight loss per week until ideal body weight through exercise & diet. Low fat/cholesterol diet.   Avoid sweets, sodas, fruit juices, sweetened beverages like tea, etc. Gradually increase exercise from 15 min daily up to 1 hr per day 5 days/week. Limit alcohol use.  I would like to recheck your liver enzymes today, I am sending your labs to [facility] for you.  The address is [address]. [place], [place]  I hope you have a wonderful Christmas and happy year!  We will see you in 6 months, or sooner if needed.  It was a pleasure to see you today. I want to create trusting relationships with patients. If you receive a survey regarding your visit,  I greatly appreciate you taking time to fill this out on paper or through your MyChart. I value your feedback.  Venetia Night, MSN, FNP-BC, AGACNP-BC [facility]

## 2022-10-08 LAB — HEPATIC FUNCTION PANEL
AG Ratio: 2 (calc) (ref 1.0–2.5)
ALT: 16 U/L (ref 6–29)
AST: 15 U/L (ref 10–35)
Albumin: 4.5 g/dL (ref 3.6–5.1)
Alkaline phosphatase (APISO): 61 U/L (ref 37–153)
Bilirubin, Direct: 0.1 mg/dL (ref 0.0–0.2)
Globulin: 2.2 g/dL (calc) (ref 1.9–3.7)
Indirect Bilirubin: 0.2 mg/dL (calc) (ref 0.2–1.2)
Total Bilirubin: 0.3 mg/dL (ref 0.2–1.2)
Total Protein: 6.7 g/dL (ref 6.1–8.1)

## 2022-12-27 ENCOUNTER — Other Ambulatory Visit: Payer: Self-pay | Admitting: Internal Medicine

## 2022-12-27 DIAGNOSIS — Z1231 Encounter for screening mammogram for malignant neoplasm of breast: Secondary | ICD-10-CM

## 2022-12-29 DIAGNOSIS — Z299 Encounter for prophylactic measures, unspecified: Secondary | ICD-10-CM | POA: Diagnosis not present

## 2022-12-29 DIAGNOSIS — Z0000 Encounter for general adult medical examination without abnormal findings: Secondary | ICD-10-CM | POA: Diagnosis not present

## 2022-12-29 DIAGNOSIS — Z6829 Body mass index (BMI) 29.0-29.9, adult: Secondary | ICD-10-CM | POA: Diagnosis not present

## 2022-12-29 DIAGNOSIS — E7800 Pure hypercholesterolemia, unspecified: Secondary | ICD-10-CM | POA: Diagnosis not present

## 2022-12-29 DIAGNOSIS — Z79899 Other long term (current) drug therapy: Secondary | ICD-10-CM | POA: Diagnosis not present

## 2022-12-29 DIAGNOSIS — R5383 Other fatigue: Secondary | ICD-10-CM | POA: Diagnosis not present

## 2023-02-17 DIAGNOSIS — D485 Neoplasm of uncertain behavior of skin: Secondary | ICD-10-CM | POA: Diagnosis not present

## 2023-02-17 DIAGNOSIS — L821 Other seborrheic keratosis: Secondary | ICD-10-CM | POA: Diagnosis not present

## 2023-02-17 DIAGNOSIS — D225 Melanocytic nevi of trunk: Secondary | ICD-10-CM | POA: Diagnosis not present

## 2023-02-17 DIAGNOSIS — L603 Nail dystrophy: Secondary | ICD-10-CM | POA: Diagnosis not present

## 2023-02-17 DIAGNOSIS — L820 Inflamed seborrheic keratosis: Secondary | ICD-10-CM | POA: Diagnosis not present

## 2023-02-28 ENCOUNTER — Encounter: Payer: Self-pay | Admitting: Gastroenterology

## 2023-03-16 ENCOUNTER — Telehealth: Payer: Self-pay

## 2023-03-16 NOTE — Telephone Encounter (Signed)
Pt called requesting a refill on Linzess 72 mcg. Pt was last seen on 10/07/2022.

## 2023-03-17 ENCOUNTER — Other Ambulatory Visit: Payer: Self-pay | Admitting: Gastroenterology

## 2023-03-17 DIAGNOSIS — K5904 Chronic idiopathic constipation: Secondary | ICD-10-CM

## 2023-03-17 MED ORDER — LINACLOTIDE 72 MCG PO CAPS: 72.0000 ug | ORAL_CAPSULE | Freq: Every day | ORAL | 5 refills | Status: AC

## 2023-04-07 DIAGNOSIS — D22122 Melanocytic nevi of left lower eyelid, including canthus: Secondary | ICD-10-CM | POA: Diagnosis not present

## 2023-04-07 DIAGNOSIS — D2210 Melanocytic nevi of unspecified eyelid, including canthus: Secondary | ICD-10-CM | POA: Diagnosis not present

## 2023-04-11 ENCOUNTER — Ambulatory Visit
Admission: RE | Admit: 2023-04-11 | Discharge: 2023-04-11 | Disposition: A | Discharge status: Home / Self Care | Payer: PPO | Source: Ambulatory Visit | Attending: Internal Medicine | Admitting: Internal Medicine

## 2023-04-11 DIAGNOSIS — Z1231 Encounter for screening mammogram for malignant neoplasm of breast: Secondary | ICD-10-CM | POA: Diagnosis not present

## 2023-04-17 ENCOUNTER — Other Ambulatory Visit: Payer: Self-pay | Admitting: Gastroenterology

## 2023-04-17 DIAGNOSIS — K219 Gastro-esophageal reflux disease without esophagitis: Secondary | ICD-10-CM

## 2023-08-18 ENCOUNTER — Encounter: Payer: Self-pay | Admitting: Gastroenterology

## 2023-08-18 ENCOUNTER — Ambulatory Visit: Payer: PPO | Admitting: Gastroenterology

## 2023-08-18 VITALS — BP 136/83 | HR 60 | Temp 98.1°F | Ht 65.0 in | Wt 168.4 lb

## 2023-08-18 DIAGNOSIS — R5382 Chronic fatigue, unspecified: Secondary | ICD-10-CM

## 2023-08-18 DIAGNOSIS — K760 Fatty (change of) liver, not elsewhere classified: Secondary | ICD-10-CM | POA: Diagnosis not present

## 2023-08-18 DIAGNOSIS — R5383 Other fatigue: Secondary | ICD-10-CM

## 2023-08-18 DIAGNOSIS — K219 Gastro-esophageal reflux disease without esophagitis: Secondary | ICD-10-CM | POA: Diagnosis not present

## 2023-08-18 DIAGNOSIS — K5904 Chronic idiopathic constipation: Secondary | ICD-10-CM

## 2023-08-18 DIAGNOSIS — R1310 Dysphagia, unspecified: Secondary | ICD-10-CM

## 2023-08-18 DIAGNOSIS — K5900 Constipation, unspecified: Secondary | ICD-10-CM

## 2023-08-18 DIAGNOSIS — R1319 Other dysphagia: Secondary | ICD-10-CM

## 2023-08-18 MED ORDER — PANTOPRAZOLE SODIUM 40 MG PO TBEC: 40.0000 mg | DELAYED_RELEASE_TABLET | Freq: Two times a day (BID) | ORAL | 1 refills | Status: DC

## 2023-08-18 NOTE — Progress Notes (Signed)
GI Office Note    Referring Provider: [name], MD Primary Care Physician:  [name], MD Primary Gastroenterologist: Gerrit Friends.Rourk, MD  Date:  08/18/2023  ID:  Dawn Hammond, DOB 1965/10/21, MRN [id number]   Chief Complaint   Chief Complaint  Patient presents with   Follow-up    Follow up. No problems    History of Present Illness  Dawn Hammond is a 57 y.o. female with a history of GERD, dysphagia, fibromyalgia, fatty liver, sensorineural hearing loss, COPD, previous tracheostomy, and mild sleep apnea presenting today for follow up.   Prior endoscopic workup 07/20/2021 EGD: -Mild Schatzki's ring -Small hiatal hernia -Gastric polyp s/p biopsy -Normal examined duodenum -Advised to increase pantoprazole to 40 mg twice daily -Pathology revealed fundic gland polyp, negative for dysplasia Colonoscopy: -Normal exam -Repeat in 10 years.   Ultrasound 07/23/2021: Hepatic steatosis   Office visit 04/19/2022.  Noted improvement in GERD with occasional breakthrough symptoms of overeating with about 3 occurrences per month.  Dysphagia not completely resolved but somewhat better.  Declined further evaluation.  Had lost 5 pounds since last visit with dietary changes and decreasing portions.  Denies any alcohol use.  Denied abdominal distention, lower extremity edema, confusion/mental status changes, jaundice, or pruritus.  Also without any BRBPR or melena.  Occasional constipation with a BM every 1-2 days, taking daily stool softener.  She was advised to continue PPI twice daily, Pepcid for breakthrough GERD symptoms, diocese lifestyle modifications reinforced, dysphagia precautions discussed.  Fatty liver handout provided.  Labs requested from PCP.  Advised to follow-up in 6 months.   Labs were reviewed by Ermalinda Memos, PA noted to have normal LFTs in March 2023.  She was recommended to have LFTs checked every 6 months with PCP.  Last office visit 10/07/22. Having an  almost daily bowel movement, takes Linzess as needed.  Was currently taking 2 stool softeners nightly with occasional straining but not very often.  Bread/carbs seem to constipate her.  Drinking plenty of water.  Reflux mostly controlled with pantoprazole 40 mg twice daily without significant breakthrough symptoms.  Have been trying to work on weight loss and watching portion sizes but not following strict diet.  Had been taking vitamin B12 supplements.  Advised to check liver enzymes, continue Linzess and daily stool softeners.  Continue PPI twice daily.  Recommended ongoing exercise and fatty liver diet.  Labs May 2024: Vitamin D 34.3, ferritin 158, TSH 1.6, hemoglobin 14.4, MCV 85.7 platelets 345  Today:  GERD, dysphagia - better than it used to be but still has some symptoms. Not overeating helps. Foods that contain lots of tomatoes she has to used tums. No trouble swallowing currently. Has rare dysphagia symptoms and still occurs every so often. May occur once every 2-3 months.   Fatty liver - She reports she has gained some weight back and is trying to get back into portion controls. Got out of sync last year at the holidays. No abdominal pain or swelling in her abdomen. Always has some mild tenderness.   Constipation - Taking equate stool softener 2 daily.  Occasionally takes Linzess if constipation worsens, usually if goes more than 1 day without BM, will primarily take if she eats more bread and still has not had a BM the next day she takes it. She states when she travels it worsens.   Had questions about her blood work. Currently taking 50 mcg. Has wellness visit in March. She has increased her B12 as well  recently. Does not sleep well some nights.   Wt Readings from Last 3 Encounters:  08/18/23 168 lb 6.4 oz (76.4 kg)  10/07/22 159 lb 6.4 oz (72.3 kg)  04/19/22 162 lb 9.6 oz (73.8 kg)    Current Outpatient Medications  Medication Sig Dispense Refill   Cholecalciferol (VITAMIN D3) 125  MCG (5000 UT) CAPS Take 5,000 Units by mouth daily.     famotidine (PEPCID) 20 MG tablet Take 20 mg by mouth daily.     ibuprofen (ADVIL,MOTRIN) 200 MG tablet Take 200 mg by mouth every 6 (six) hours as needed for moderate pain.     linaclotide (LINZESS) 72 MCG capsule Take 1 capsule (72 mcg total) by mouth daily before breakfast. 30 capsule 5   Methylcobalamin (B-12) 5000 MCG TBDP Take 5,000 mcg by mouth daily.     OVER THE COUNTER MEDICATION Use as directed 100 mg in the mouth or throat daily. Equate stool softener     pantoprazole (PROTONIX) 40 MG tablet TAKE 1 TABLET BY MOUTH TWICE A DAY 180 tablet 1   vitamin C (ASCORBIC ACID) 500 MG tablet Take 500 mg by mouth daily.     vitamin E 400 UNIT capsule Take 400 Units by mouth daily.       No current facility-administered medications for this visit.    Past Medical History:  Diagnosis Date   Chronic sinusitis    COPD (chronic obstructive pulmonary disease) (HCC)    emphysema   Fibromyalgia    GERD (gastroesophageal reflux disease)    Impaired hearing    Pneumonia, viral 1984   x2 tracheostomy/ Collapsed lung   PONV (postoperative nausea and vomiting)    after septoplasty surgery   Sleep apnea    does not wear a CPAP-very mild   Urticaria     Past Surgical History:  Procedure Laterality Date   APPENDECTOMY  2015   BIOPSY  07/20/2021   Procedure: BIOPSY;  Surgeon: Corbin Ade, MD;  Location: [facility];  Service: Endoscopy;;   COLONOSCOPY  08/11/2011   Rourk: Normal   COLONOSCOPY WITH PROPOFOL N/A 07/20/2021   Surgeon: Corbin Ade, MD;   Normal exam.  Repeat in 10 years.   ESOPHAGOGASTRODUODENOSCOPY  08/18/2010   noncritical schatzki ring s/p dilation, small hh, biopsy negative for EE   ESOPHAGOGASTRODUODENOSCOPY (EGD) WITH PROPOFOL N/A 07/20/2021   Surgeon: Corbin Ade, MD;  Mild Schatzki's ring, small hiatal hernia, gastric polyp s/p biopsy, normal examined duodenum.   MALONEY DILATION N/A 07/20/2021    Procedure: Elease Hashimoto DILATION;  Surgeon: Corbin Ade, MD;  Location: [facility];  Service: Endoscopy;  Laterality: N/A;   NASAL SEPTOPLASTY W/ TURBINOPLASTY  03/06/2012   Procedure: NASAL SEPTOPLASTY WITH TURBINATE REDUCTION;  Surgeon: Flo Shanks, MD;  Location: [facility];  Service: ENT;  Laterality: Bilateral;   TOTAL ABDOMINAL HYSTERECTOMY     tracheostomy  10/18/1982    Family History  Problem Relation Age of Onset   Breast cancer Maternal Aunt    Uterine cancer Maternal Aunt    Kidney cancer Mother    Colon cancer Neg Hx     Allergies as of 08/18/2023 - Review Complete 08/18/2023  Allergen Reaction Noted   Amoxicillin Rash 07/22/2010   Clindamycin/lincomycin Rash 03/01/2012   Dilaudid [hydromorphone] Rash 07/14/2021   Hydrocodone Itching, Nausea And Vomiting, and Rash    Oxycodone hcl Itching, Nausea And Vomiting, and Rash 07/22/2010    Social History   Socioeconomic History  Marital status: Widowed    Spouse name: Kathlene November   Number of children: 0   Years of education: Not on file   Highest education level: GED or equivalent  Occupational History   Occupation: retired due to hearing impairment  Tobacco Use   Smoking status: Never   Smokeless tobacco: Never  Vaping Use   Vaping status: Never Used  Substance and Sexual Activity   Alcohol use: No   Drug use: No   Sexual activity: Not on file  Other Topics Concern   Not on file  Social History Narrative   Patient is right-handed. She lives with her husband ina one level home. She drinks 3 cups of coffee a day. She does not exercise.   Social Determinants of Health   Financial Resource Strain: Not on file  Food Insecurity: Not on file  Transportation Needs: Not on file  Physical Activity: Not on file  Stress: Not on file  Social Connections: Not on file     Review of Systems   Gen: Denies fever, chills, anorexia. Denies fatigue, weakness, weight loss.  CV: Denies chest pain,  palpitations, syncope, peripheral edema, and claudication. Resp: Denies dyspnea at rest, cough, wheezing, coughing up blood, and pleurisy. GI: See HPI Derm: Denies rash, itching, dry skin Psych: Denies depression, anxiety, memory loss, confusion. No homicidal or suicidal ideation.  Heme: Denies bruising, bleeding, and enlarged lymph nodes. + hearing loss  Physical Exam   BP 136/83 (BP Location: Right Arm, Patient Position: Sitting, Cuff Size: Normal)   Pulse 60   Temp 98.1 F (36.7 C) (Temporal)   Ht 5\' 5"  (1.651 m)   Wt 168 lb 6.4 oz (76.4 kg)   BMI 28.02 kg/m   General:   Alert and oriented. No distress noted. Pleasant and cooperative.  Head:  Normocephalic and atraumatic. Eyes:  Conjuctiva clear without scleral icterus. Ears: hearing impaired. Hearing aids in place  Mouth:  Oral mucosa pink and moist. Good dentition. No lesions. Lungs:  Clear to auscultation bilaterally. No wheezes, rales, or rhonchi. No distress.  Heart:  S1, S2 present without murmurs appreciated.  Abdomen:  +BS, soft,  non-distended. Mild ttp to epigastrium. No rebound or guarding. No HSM or masses noted. Rectal: deferred Msk:  Symmetrical without gross deformities. Normal posture. Extremities:  Without edema. Neurologic:  Alert and  oriented x4 Psych:  Alert and cooperative. Normal mood and affect.   Assessment  Dawn Hammond is a 57 y.o. female with a history of GERD, dysphagia, fibromyalgia, fatty liver, sensorineural hearing loss, COPD, previous tracheostomy, and mild sleep apnea presenting today for follow up.   GERD, dysphagia: Intermittent reflux symptoms especially with overeating or exposure to trigger foods such as tomato based products.  For now would like to continue pantoprazole 40 mg twice daily which I refilled today.  Reinforced GERD diet.  Having some occasional dysphagia symptoms especially with bread but very rare.  Would not like to pursue any further evaluation at this time.   Discussed potentially changing PPI therapy to help with dysphagia and reflux symptoms in the future if symptoms become more frequent.  Fatty liver: Without decompensation.  Previously had lost weight but recently gained some of it back.  Has not been exercising as much, previously working on portion control.  She states she is getting it back to this around the holidays.  Discussed ongoing efforts for weight loss and that we need to monitor her liver enzymes.  She would like to wait to check  this with her primary care in March.  Constipation: Fairly well-controlled currently.  Not having to strain.  Taking 2 stool softeners daily and will occasionally have to take Linzess as needed if no bowel movement within the last 24 hours to avoid significant constipation.  Reports good water intake.  Fatigue: Has been feeling fatigued, blood work she brought with her from March showed vitamin D low at 34.3.  I advised her that she could increase her vitamin D supplement if she would like and can recheck labs in 3-6 months.  She is also taking vitamin B12 daily to help with fatigue.  Recommended TSH with her blood work as well.  PLAN   Can increase Vitamin D if she would like to 5000iu daily Advised CBC, CMP, TSH, Lipid panel, Vitamin D, Vitamin B12 to be checked with her PCP. Work toward weight loss Continue 2 stool softeners daily. Continue Linzess as needed Continue pantoprazole 40 mg BID. Refilled.  GERD diet Fatty liver diet discussed.  Follow up in 6 months    Brooke Bonito, MSN, FNP-BC, AGACNP-BC [facility]

## 2023-08-18 NOTE — Patient Instructions (Addendum)
Continue taking your vitamin B12 and vitamin D.  If you would like you can increase your vitamin D to 5000 units daily.  Continue to work toward weight loss to help control your fatty liver.  Continue your 2 stool softeners daily.  You may also continue to use your Linzess as needed.  I have refilled your pantoprazole for you today.  Please continue taking this twice daily and if you have any worsening reflux symptoms for more issues with swallowing please let me know and we can consider changing therapy.  When you have labs checked with your primary care next I recommend that you have the following labs performed: CBC CMP TSH Lipid panel Vitamin D level Vitamin B12 level  We will plan to follow-up in 6 months, sooner if needed.  It was a pleasure to see you today. I want to create trusting relationships with patients. If you receive a survey regarding your visit,  I greatly appreciate you taking time to fill this out on paper or through your MyChart. I value your feedback.  Brooke Bonito, MSN, FNP-BC, AGACNP-BC [facility]

## 2023-11-22 ENCOUNTER — Other Ambulatory Visit: Payer: Self-pay | Admitting: Internal Medicine

## 2023-11-22 DIAGNOSIS — Z1231 Encounter for screening mammogram for malignant neoplasm of breast: Secondary | ICD-10-CM

## 2023-12-02 DIAGNOSIS — R059 Cough, unspecified: Secondary | ICD-10-CM | POA: Diagnosis not present

## 2023-12-02 DIAGNOSIS — J111 Influenza due to unidentified influenza virus with other respiratory manifestations: Secondary | ICD-10-CM | POA: Diagnosis not present

## 2023-12-02 DIAGNOSIS — Z299 Encounter for prophylactic measures, unspecified: Secondary | ICD-10-CM | POA: Diagnosis not present

## 2023-12-30 DIAGNOSIS — Z299 Encounter for prophylactic measures, unspecified: Secondary | ICD-10-CM | POA: Diagnosis not present

## 2023-12-30 DIAGNOSIS — Z1331 Encounter for screening for depression: Secondary | ICD-10-CM | POA: Diagnosis not present

## 2023-12-30 DIAGNOSIS — Z7189 Other specified counseling: Secondary | ICD-10-CM | POA: Diagnosis not present

## 2023-12-30 DIAGNOSIS — E559 Vitamin D deficiency, unspecified: Secondary | ICD-10-CM | POA: Diagnosis not present

## 2023-12-30 DIAGNOSIS — Z0000 Encounter for general adult medical examination without abnormal findings: Secondary | ICD-10-CM | POA: Diagnosis not present

## 2023-12-30 DIAGNOSIS — E7800 Pure hypercholesterolemia, unspecified: Secondary | ICD-10-CM | POA: Diagnosis not present

## 2023-12-30 DIAGNOSIS — R5383 Other fatigue: Secondary | ICD-10-CM | POA: Diagnosis not present

## 2023-12-30 DIAGNOSIS — M461 Sacroiliitis, not elsewhere classified: Secondary | ICD-10-CM | POA: Diagnosis not present

## 2023-12-30 DIAGNOSIS — Z79899 Other long term (current) drug therapy: Secondary | ICD-10-CM | POA: Diagnosis not present

## 2024-01-04 ENCOUNTER — Encounter: Payer: Self-pay | Admitting: Gastroenterology

## 2024-01-26 DIAGNOSIS — D225 Melanocytic nevi of trunk: Secondary | ICD-10-CM | POA: Diagnosis not present

## 2024-01-26 DIAGNOSIS — L820 Inflamed seborrheic keratosis: Secondary | ICD-10-CM | POA: Diagnosis not present

## 2024-01-26 DIAGNOSIS — Z1283 Encounter for screening for malignant neoplasm of skin: Secondary | ICD-10-CM | POA: Diagnosis not present

## 2024-02-02 DIAGNOSIS — H903 Sensorineural hearing loss, bilateral: Secondary | ICD-10-CM | POA: Diagnosis not present

## 2024-03-06 DIAGNOSIS — Z299 Encounter for prophylactic measures, unspecified: Secondary | ICD-10-CM | POA: Diagnosis not present

## 2024-03-06 DIAGNOSIS — M7732 Calcaneal spur, left foot: Secondary | ICD-10-CM | POA: Diagnosis not present

## 2024-03-06 DIAGNOSIS — R252 Cramp and spasm: Secondary | ICD-10-CM | POA: Diagnosis not present

## 2024-03-06 DIAGNOSIS — E7800 Pure hypercholesterolemia, unspecified: Secondary | ICD-10-CM | POA: Diagnosis not present

## 2024-03-06 DIAGNOSIS — R52 Pain, unspecified: Secondary | ICD-10-CM | POA: Diagnosis not present

## 2024-04-11 ENCOUNTER — Ambulatory Visit
Admission: RE | Admit: 2024-04-11 | Discharge: 2024-04-11 | Disposition: A | Discharge status: Home / Self Care | Payer: PPO | Source: Ambulatory Visit | Attending: Internal Medicine | Admitting: Internal Medicine

## 2024-04-11 DIAGNOSIS — Z1231 Encounter for screening mammogram for malignant neoplasm of breast: Secondary | ICD-10-CM

## 2024-04-16 ENCOUNTER — Other Ambulatory Visit: Payer: Self-pay | Admitting: Internal Medicine

## 2024-04-16 DIAGNOSIS — R928 Other abnormal and inconclusive findings on diagnostic imaging of breast: Secondary | ICD-10-CM

## 2024-04-24 ENCOUNTER — Ambulatory Visit
Admission: RE | Admit: 2024-04-24 | Discharge: 2024-04-24 | Disposition: A | Discharge status: Home / Self Care | Source: Ambulatory Visit | Attending: Internal Medicine | Admitting: Internal Medicine

## 2024-04-24 DIAGNOSIS — N6002 Solitary cyst of left breast: Secondary | ICD-10-CM | POA: Diagnosis not present

## 2024-04-24 DIAGNOSIS — R928 Other abnormal and inconclusive findings on diagnostic imaging of breast: Secondary | ICD-10-CM

## 2024-04-24 DIAGNOSIS — N6324 Unspecified lump in the left breast, lower inner quadrant: Secondary | ICD-10-CM | POA: Diagnosis not present

## 2024-05-13 ENCOUNTER — Other Ambulatory Visit: Payer: Self-pay | Admitting: Gastroenterology

## 2024-05-13 DIAGNOSIS — K219 Gastro-esophageal reflux disease without esophagitis: Secondary | ICD-10-CM

## 2024-06-12 DIAGNOSIS — M438X4 Other specified deforming dorsopathies, thoracic region: Secondary | ICD-10-CM | POA: Diagnosis not present

## 2024-06-12 DIAGNOSIS — R52 Pain, unspecified: Secondary | ICD-10-CM | POA: Diagnosis not present

## 2024-06-12 DIAGNOSIS — M549 Dorsalgia, unspecified: Secondary | ICD-10-CM | POA: Diagnosis not present

## 2024-06-12 DIAGNOSIS — M898X1 Other specified disorders of bone, shoulder: Secondary | ICD-10-CM | POA: Diagnosis not present

## 2024-06-12 DIAGNOSIS — M47814 Spondylosis without myelopathy or radiculopathy, thoracic region: Secondary | ICD-10-CM | POA: Diagnosis not present

## 2024-06-12 DIAGNOSIS — E7800 Pure hypercholesterolemia, unspecified: Secondary | ICD-10-CM | POA: Diagnosis not present

## 2024-06-12 DIAGNOSIS — Z299 Encounter for prophylactic measures, unspecified: Secondary | ICD-10-CM | POA: Diagnosis not present

## 2024-06-27 DIAGNOSIS — Z6828 Body mass index (BMI) 28.0-28.9, adult: Secondary | ICD-10-CM | POA: Diagnosis not present

## 2024-06-27 DIAGNOSIS — M549 Dorsalgia, unspecified: Secondary | ICD-10-CM | POA: Diagnosis not present

## 2024-06-27 DIAGNOSIS — R52 Pain, unspecified: Secondary | ICD-10-CM | POA: Diagnosis not present

## 2024-06-27 DIAGNOSIS — Z299 Encounter for prophylactic measures, unspecified: Secondary | ICD-10-CM | POA: Diagnosis not present

## 2024-07-04 DIAGNOSIS — R52 Pain, unspecified: Secondary | ICD-10-CM | POA: Diagnosis not present

## 2024-07-04 DIAGNOSIS — M549 Dorsalgia, unspecified: Secondary | ICD-10-CM | POA: Diagnosis not present

## 2024-07-04 DIAGNOSIS — Z6828 Body mass index (BMI) 28.0-28.9, adult: Secondary | ICD-10-CM | POA: Diagnosis not present

## 2024-07-04 DIAGNOSIS — Z299 Encounter for prophylactic measures, unspecified: Secondary | ICD-10-CM | POA: Diagnosis not present

## 2024-07-13 DIAGNOSIS — M4184 Other forms of scoliosis, thoracic region: Secondary | ICD-10-CM | POA: Diagnosis not present

## 2024-07-13 DIAGNOSIS — M47816 Spondylosis without myelopathy or radiculopathy, lumbar region: Secondary | ICD-10-CM | POA: Diagnosis not present

## 2024-07-13 DIAGNOSIS — M545 Low back pain, unspecified: Secondary | ICD-10-CM | POA: Diagnosis not present

## 2024-07-13 DIAGNOSIS — M546 Pain in thoracic spine: Secondary | ICD-10-CM | POA: Diagnosis not present

## 2024-07-13 DIAGNOSIS — M549 Dorsalgia, unspecified: Secondary | ICD-10-CM | POA: Diagnosis not present

## 2024-07-18 DIAGNOSIS — Z299 Encounter for prophylactic measures, unspecified: Secondary | ICD-10-CM | POA: Diagnosis not present

## 2024-07-18 DIAGNOSIS — M419 Scoliosis, unspecified: Secondary | ICD-10-CM | POA: Diagnosis not present

## 2024-07-18 DIAGNOSIS — R52 Pain, unspecified: Secondary | ICD-10-CM | POA: Diagnosis not present

## 2024-07-18 DIAGNOSIS — M549 Dorsalgia, unspecified: Secondary | ICD-10-CM | POA: Diagnosis not present

## 2024-07-18 DIAGNOSIS — Z6828 Body mass index (BMI) 28.0-28.9, adult: Secondary | ICD-10-CM | POA: Diagnosis not present

## 2024-07-24 DIAGNOSIS — Z6828 Body mass index (BMI) 28.0-28.9, adult: Secondary | ICD-10-CM | POA: Diagnosis not present

## 2024-07-24 DIAGNOSIS — R202 Paresthesia of skin: Secondary | ICD-10-CM | POA: Diagnosis not present

## 2024-10-19 ENCOUNTER — Other Ambulatory Visit: Payer: Self-pay | Admitting: Internal Medicine

## 2024-10-19 DIAGNOSIS — Z1231 Encounter for screening mammogram for malignant neoplasm of breast: Secondary | ICD-10-CM

## 2024-11-09 ENCOUNTER — Other Ambulatory Visit: Payer: Self-pay | Admitting: Gastroenterology

## 2024-11-09 DIAGNOSIS — K219 Gastro-esophageal reflux disease without esophagitis: Secondary | ICD-10-CM

## 2025-04-16 ENCOUNTER — Ambulatory Visit
